# Patient Record
Sex: Female | Born: 1970
Health system: Southern US, Community
[De-identification: ages and names within clinical notes are randomized; demographics above are authoritative.]

## PROBLEM LIST (undated history)

## (undated) ENCOUNTER — Ambulatory Visit (HOSPITAL_COMMUNITY): Admission: EM | Payer: BC Managed Care – PPO | Source: Home / Self Care

## (undated) DIAGNOSIS — G709 Myoneural disorder, unspecified: Secondary | ICD-10-CM

## (undated) DIAGNOSIS — L309 Dermatitis, unspecified: Secondary | ICD-10-CM

## (undated) HISTORY — DX: Myoneural disorder, unspecified: G70.9

---

## 2005-09-07 ENCOUNTER — Emergency Department (HOSPITAL_COMMUNITY): Admission: EM | Admit: 2005-09-07 | Discharge: 2005-09-07 | Payer: Self-pay | Admitting: Emergency Medicine

## 2005-09-10 ENCOUNTER — Emergency Department (HOSPITAL_COMMUNITY): Admission: EM | Admit: 2005-09-10 | Discharge: 2005-09-10 | Payer: Self-pay | Admitting: Emergency Medicine

## 2005-11-20 ENCOUNTER — Inpatient Hospital Stay (HOSPITAL_COMMUNITY): Admission: AD | Admit: 2005-11-20 | Discharge: 2005-11-20 | Payer: Self-pay | Admitting: *Deleted

## 2006-03-16 ENCOUNTER — Inpatient Hospital Stay (HOSPITAL_COMMUNITY): Admission: AD | Admit: 2006-03-16 | Discharge: 2006-03-16 | Payer: Self-pay | Admitting: Obstetrics & Gynecology

## 2006-03-24 ENCOUNTER — Inpatient Hospital Stay (HOSPITAL_COMMUNITY): Admission: AD | Admit: 2006-03-24 | Discharge: 2006-03-28 | Payer: Self-pay | Admitting: Obstetrics & Gynecology

## 2006-03-31 ENCOUNTER — Inpatient Hospital Stay (HOSPITAL_COMMUNITY): Admission: AD | Admit: 2006-03-31 | Discharge: 2006-03-31 | Payer: Self-pay | Admitting: Obstetrics & Gynecology

## 2007-03-22 ENCOUNTER — Ambulatory Visit: Payer: Self-pay | Admitting: *Deleted

## 2007-03-22 ENCOUNTER — Ambulatory Visit: Payer: Self-pay | Admitting: Family Medicine

## 2007-03-22 LAB — CONVERTED CEMR LAB
ALT: 14 units/L (ref 0–35)
AST: 16 units/L (ref 0–37)
Albumin: 4.2 g/dL (ref 3.5–5.2)
Basophils Absolute: 0 10*3/uL (ref 0.0–0.1)
CO2: 24 meq/L (ref 19–32)
Calcium: 9.3 mg/dL (ref 8.4–10.5)
Chloride: 107 meq/L (ref 96–112)
Lymphocytes Relative: 47 % — ABNORMAL HIGH (ref 12–46)
Lymphs Abs: 1.9 10*3/uL (ref 0.7–3.3)
Neutro Abs: 1.7 10*3/uL (ref 1.7–7.7)
Platelets: 289 10*3/uL (ref 150–400)
Potassium: 4.3 meq/L (ref 3.5–5.3)
RDW: 13.6 % (ref 11.5–14.0)
Sed Rate: 14 mm/hr (ref 0–22)
Sodium: 140 meq/L (ref 135–145)
Total Protein: 7.5 g/dL (ref 6.0–8.3)
WBC: 4 10*3/uL (ref 4.0–10.5)

## 2007-03-23 ENCOUNTER — Encounter (INDEPENDENT_AMBULATORY_CARE_PROVIDER_SITE_OTHER): Payer: Self-pay | Admitting: Family Medicine

## 2007-03-23 LAB — CONVERTED CEMR LAB: TSH: 0.7 microintl units/mL

## 2007-04-17 ENCOUNTER — Ambulatory Visit: Payer: Self-pay | Admitting: Family Medicine

## 2007-04-20 ENCOUNTER — Encounter (INDEPENDENT_AMBULATORY_CARE_PROVIDER_SITE_OTHER): Payer: Self-pay | Admitting: Family Medicine

## 2007-04-20 DIAGNOSIS — R109 Unspecified abdominal pain: Secondary | ICD-10-CM | POA: Insufficient documentation

## 2007-04-23 DIAGNOSIS — M129 Arthropathy, unspecified: Secondary | ICD-10-CM | POA: Insufficient documentation

## 2007-08-14 ENCOUNTER — Ambulatory Visit: Payer: Self-pay | Admitting: Family Medicine

## 2007-10-25 ENCOUNTER — Emergency Department (HOSPITAL_COMMUNITY): Admission: EM | Admit: 2007-10-25 | Discharge: 2007-10-25 | Payer: Self-pay | Admitting: Family Medicine

## 2007-11-29 ENCOUNTER — Encounter (INDEPENDENT_AMBULATORY_CARE_PROVIDER_SITE_OTHER): Payer: Self-pay | Admitting: Family Medicine

## 2007-11-29 ENCOUNTER — Ambulatory Visit: Payer: Self-pay | Admitting: Internal Medicine

## 2007-12-06 ENCOUNTER — Ambulatory Visit (HOSPITAL_COMMUNITY): Admission: RE | Admit: 2007-12-06 | Discharge: 2007-12-06 | Payer: Self-pay | Admitting: Family Medicine

## 2008-04-01 ENCOUNTER — Encounter: Admission: RE | Admit: 2008-04-01 | Discharge: 2008-04-30 | Payer: Self-pay | Admitting: Obstetrics & Gynecology

## 2008-04-30 ENCOUNTER — Ambulatory Visit (HOSPITAL_COMMUNITY): Admission: RE | Admit: 2008-04-30 | Discharge: 2008-04-30 | Payer: Self-pay | Admitting: Obstetrics & Gynecology

## 2008-07-08 ENCOUNTER — Ambulatory Visit (HOSPITAL_COMMUNITY): Admission: RE | Admit: 2008-07-08 | Discharge: 2008-07-08 | Payer: Self-pay | Admitting: Obstetrics & Gynecology

## 2008-09-18 ENCOUNTER — Inpatient Hospital Stay (HOSPITAL_COMMUNITY): Admission: RE | Admit: 2008-09-18 | Discharge: 2008-09-24 | Payer: Self-pay | Admitting: Obstetrics & Gynecology

## 2008-09-21 ENCOUNTER — Encounter: Payer: Self-pay | Admitting: Obstetrics & Gynecology

## 2008-09-28 ENCOUNTER — Inpatient Hospital Stay (HOSPITAL_COMMUNITY): Admission: AD | Admit: 2008-09-28 | Discharge: 2008-09-29 | Payer: Self-pay | Admitting: Obstetrics & Gynecology

## 2008-11-10 ENCOUNTER — Emergency Department (HOSPITAL_COMMUNITY): Admission: EM | Admit: 2008-11-10 | Discharge: 2008-11-10 | Payer: Self-pay | Admitting: Family Medicine

## 2009-01-20 IMAGING — US US ABDOMEN COMPLETE
1 series · 14 of 25 positions shown · non-contrast
Comparison: None

CLINICAL DATA: Abdominal and epigastric pain.

ABDOMEN ULTRASOUND
TECHNIQUE: Complete abdominal ultrasound examination was performed
including evaluation of the liver, gallbladder, bile ducts,
pancreas, kidneys, spleen, IVC, and abdominal aorta.

[Series 1: unknown · 0.28mm/px · 14 of 80 slices shown]
[im 1/80]
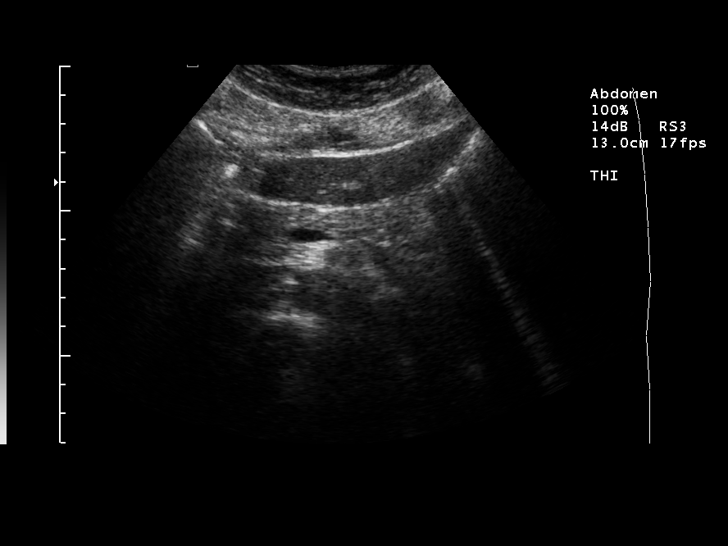
[im 7/80]
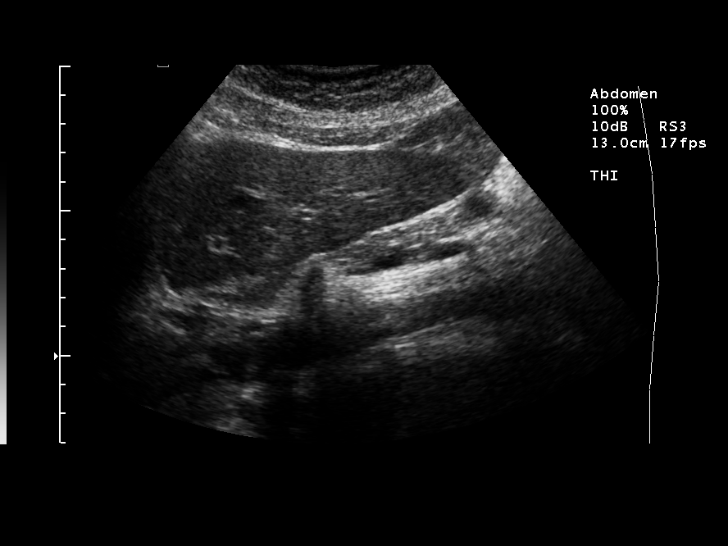
[im 14/80]
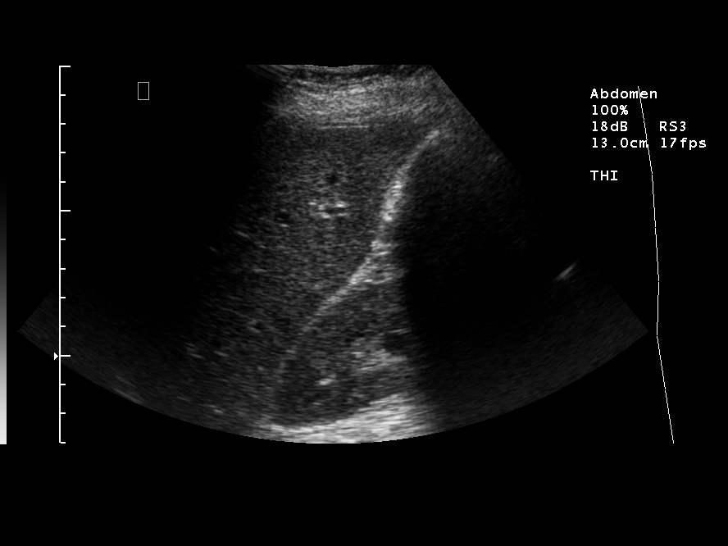
[im 20/80]
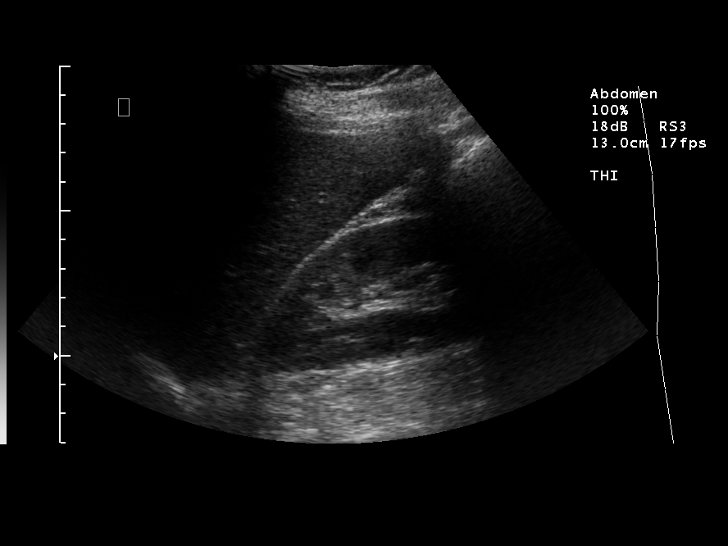
[im 27/80]
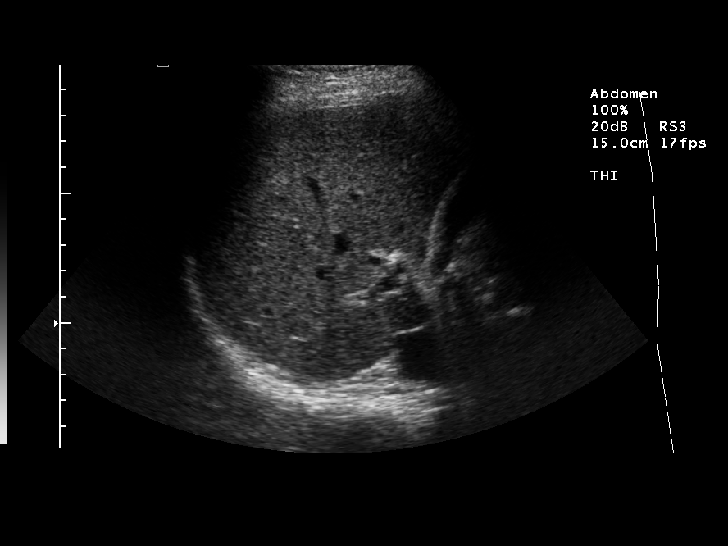
[im 30/80]
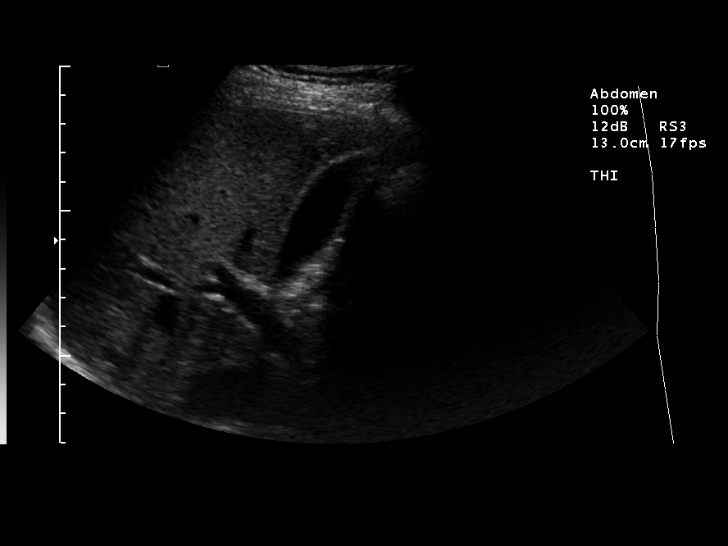
[im 37/80]
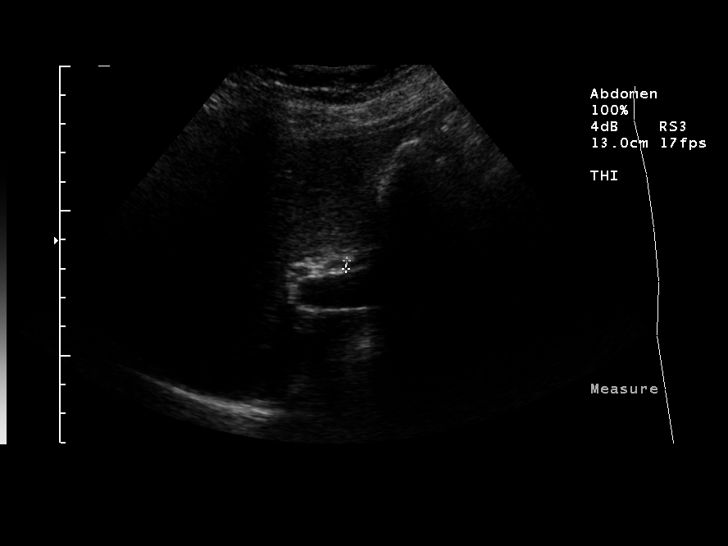
[im 43/80]
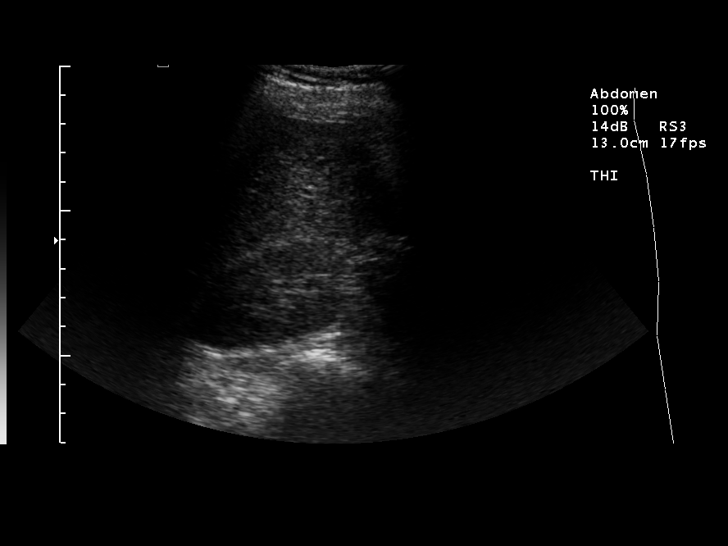
[im 50/80]
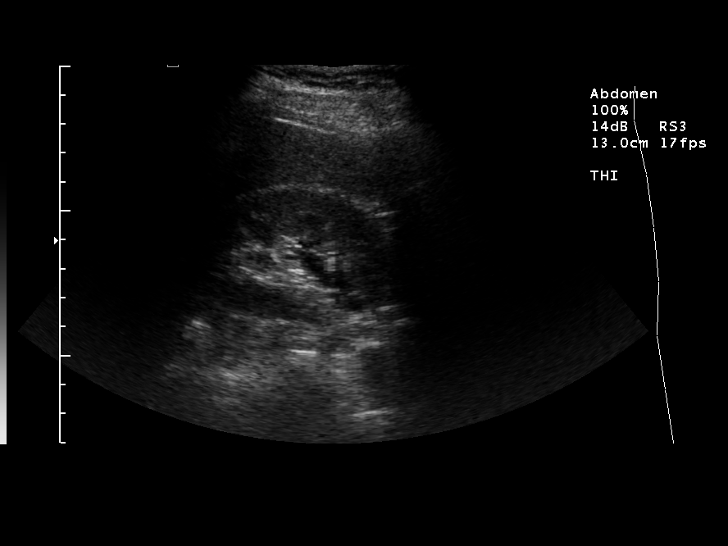
[im 53/80]
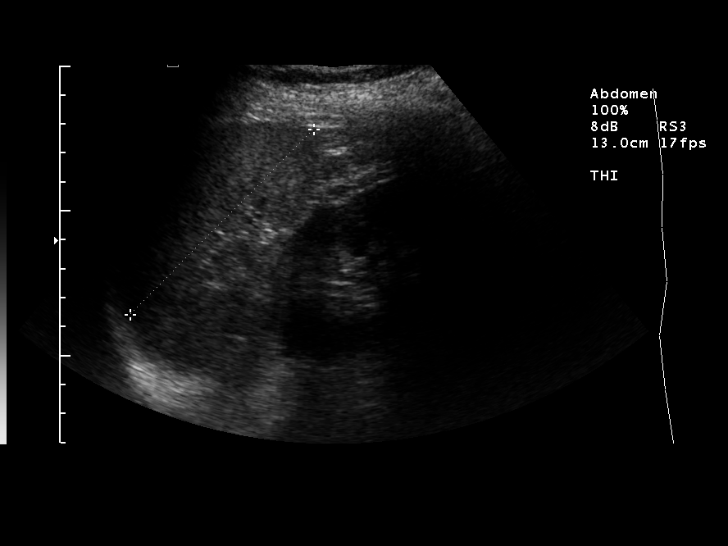
[im 60/80]
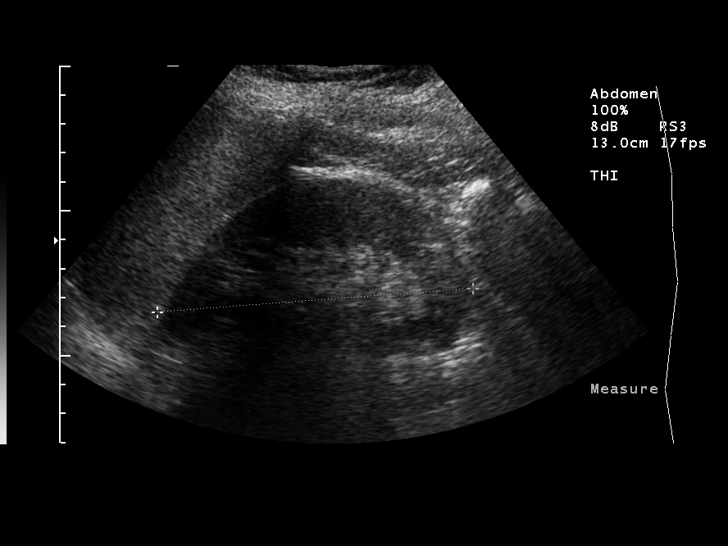
[im 66/80]
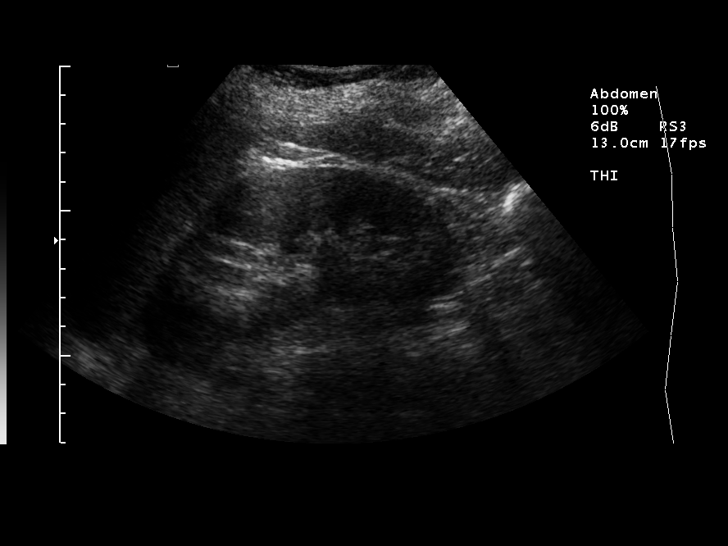
[im 73/80]
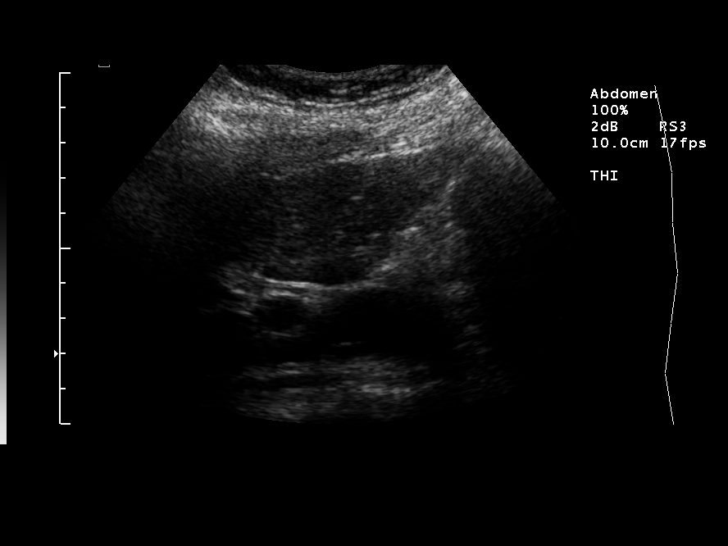
[im 80/80]
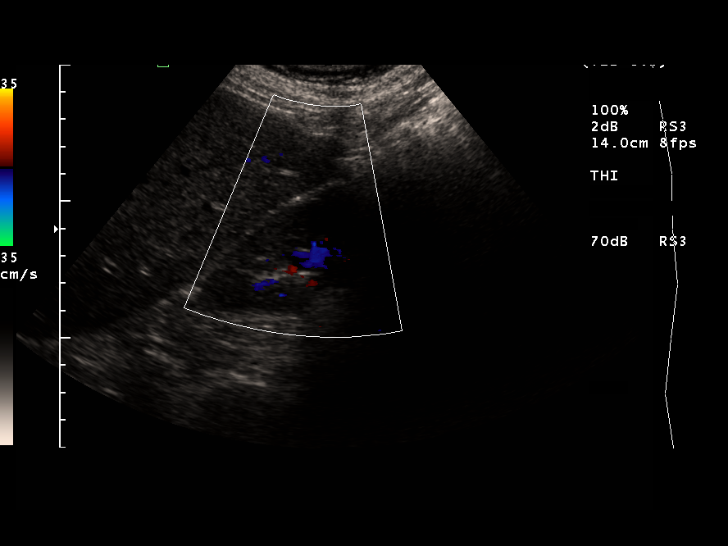

[14 of 25 positions shown; findings below may reference images not displayed]

FINDINGS: Gallbladder is normal without wall thickening, stone, or
pericholecystic fluid.  Sonographic Murphy's sign was not elicited.

Common duct normal at 3 mm.

Liver, IVC, pancreas all within normal limits.

Spleen normal in size and echotexture.

Right kidney 10.9 cm and left kidney 10.9 cm.  No hydronephrosis.

Abdominal aorta nonaneurysmal without ascites.
IMPRESSION: No acute abdominal process.

## 2009-05-04 ENCOUNTER — Encounter (INDEPENDENT_AMBULATORY_CARE_PROVIDER_SITE_OTHER): Payer: Self-pay | Admitting: Family Medicine

## 2009-05-04 ENCOUNTER — Ambulatory Visit: Payer: Self-pay | Admitting: Family Medicine

## 2009-05-04 LAB — CONVERTED CEMR LAB
Eosinophils Absolute: 0.1 10*3/uL (ref 0.0–0.7)
HCT: 38.3 % (ref 36.0–46.0)
Hemoglobin: 12.7 g/dL (ref 12.0–15.0)
Lymphs Abs: 2.2 10*3/uL (ref 0.7–4.0)
MCV: 86.7 fL (ref 78.0–100.0)
Monocytes Absolute: 0.4 10*3/uL (ref 0.1–1.0)
Monocytes Relative: 8 % (ref 3–12)
Neutrophils Relative %: 42 % — ABNORMAL LOW (ref 43–77)
RBC: 4.42 M/uL (ref 3.87–5.11)
WBC: 4.5 10*3/uL (ref 4.0–10.5)

## 2009-10-22 ENCOUNTER — Ambulatory Visit: Payer: Self-pay | Admitting: Family Medicine

## 2009-10-23 ENCOUNTER — Ambulatory Visit: Payer: Self-pay | Admitting: Family Medicine

## 2009-10-23 LAB — CONVERTED CEMR LAB
Chloride: 103 meq/L (ref 96–112)
Glucose, Bld: 91 mg/dL (ref 70–99)
Potassium: 4.1 meq/L (ref 3.5–5.3)
Sodium: 139 meq/L (ref 135–145)

## 2009-12-29 ENCOUNTER — Ambulatory Visit: Payer: Self-pay | Admitting: Family Medicine

## 2009-12-29 LAB — CONVERTED CEMR LAB: Vit D, 25-Hydroxy: 28 ng/mL — ABNORMAL LOW (ref 30–89)

## 2010-11-03 ENCOUNTER — Other Ambulatory Visit (HOSPITAL_COMMUNITY): Payer: Self-pay | Admitting: Family Medicine

## 2010-11-03 DIAGNOSIS — Z1231 Encounter for screening mammogram for malignant neoplasm of breast: Secondary | ICD-10-CM

## 2010-11-05 ENCOUNTER — Ambulatory Visit (HOSPITAL_COMMUNITY)
Admission: RE | Admit: 2010-11-05 | Discharge: 2010-11-05 | Disposition: A | Discharge status: Home / Self Care | Payer: Medicaid Other | Source: Ambulatory Visit | Attending: Family Medicine | Admitting: Family Medicine

## 2010-11-05 DIAGNOSIS — Z1231 Encounter for screening mammogram for malignant neoplasm of breast: Secondary | ICD-10-CM | POA: Insufficient documentation

## 2010-11-30 LAB — CBC
HCT: 38 % (ref 36.0–46.0)
Hemoglobin: 11.9 g/dL — ABNORMAL LOW (ref 12.0–15.0)
Hemoglobin: 12.8 g/dL (ref 12.0–15.0)
MCHC: 34.2 g/dL (ref 30.0–36.0)
MCHC: 34.3 g/dL (ref 30.0–36.0)
MCV: 90.7 fL (ref 78.0–100.0)
MCV: 91.6 fL (ref 78.0–100.0)
Platelets: 155 10*3/uL (ref 150–400)
Platelets: 368 10*3/uL (ref 150–400)
RBC: 3.81 MIL/uL — ABNORMAL LOW (ref 3.87–5.11)
RDW: 14.4 % (ref 11.5–15.5)
WBC: 5.1 10*3/uL (ref 4.0–10.5)
WBC: 7.1 10*3/uL (ref 4.0–10.5)

## 2010-11-30 LAB — COMPREHENSIVE METABOLIC PANEL
AST: 18 U/L (ref 0–37)
Albumin: 2.8 g/dL — ABNORMAL LOW (ref 3.5–5.2)
Alkaline Phosphatase: 75 U/L (ref 39–117)
Chloride: 107 mEq/L (ref 96–112)
Creatinine, Ser: 0.43 mg/dL (ref 0.4–1.2)
GFR calc Af Amer: >60 mL/min (ref >60)
Potassium: 3.6 mEq/L (ref 3.5–5.1)
Total Bilirubin: 0.5 mg/dL (ref 0.3–1.2)

## 2010-11-30 LAB — DIFFERENTIAL
Basophils Absolute: 0 10*3/uL (ref 0.0–0.1)
Eosinophils Relative: 2 % (ref 0–5)
Lymphocytes Relative: 31 % (ref 12–46)
Monocytes Absolute: 0.3 10*3/uL (ref 0.1–1.0)

## 2010-12-28 NOTE — Op Note (Signed)
NAMEVELDA, [name] NO.:  [id number]   MEDICAL RECORD NO.:  [id number]           PATIENT TYPE:   LOCATION:                                 FACILITY:   PHYSICIAN:  Roseanna Rainbow, M.D.DATE OF BIRTH:  November 18, 1970   DATE OF PROCEDURE:  DATE OF DISCHARGE:                               OPERATIVE REPORT   PREOPERATIVE DIAGNOSES:  Intrauterine pregnancy at term, arrest of  descent, persistent occiput posterior, suspicious fetal heart tracing  with prolonged decelerations.   POSTOPERATIVE DIAGNOSES:  Intrauterine pregnancy at term, arrest of  descent, persistent occiput posterior, suspicious fetal heart tracing  with prolonged decelerations, contraction ring of Bandl.   PROCEDURE:  Primary low uterine flap elliptical cesarean delivery via  low transverse incision.   SURGEON:  Roseanna Rainbow, MD   ANESTHESIA:  Epidural.   ESTIMATED BLOOD LOSS:  600 mL.   IV FLUIDS:  1500 mL crystalloid.   URINE OUTPUT:  200 mL.  The urine was blood tinged at the beginning of  the procedure.   SPECIMENS:  Placenta to Pathology.   COMPLICATIONS:  None.   PROCEDURE IN DETAIL:  The patient was taken to the operating room with  an IV running and an epidural catheter in situ.  She is then placed in  the dorsal supine position with a leftward tilt and prepped and draped  in the usual sterile fashion.  A transverse skin incision was then made  with the scalpel and carried down to the underlying fascia.  The fascia  was nicked in the midline.  The fascial incision was then extended  bilaterally.  The superior aspect of the fascial incision was tented up  and the underlying rectus muscle was dissected off.  The inferior aspect  of the fascial incision was manipulated in a similar fashion.  The  rectus muscles were separated in the midline.  The parietal peritoneum  was tented up and entered sharply.  This incision was then extended  superiorly and inferiorly with good  visualization of the bladder.  Upon  inspection of the lower uterine segment, there was a sharp demarcation  between the lower uterine segment and the muscle of the fundus  consistent with a likely Bandl's ring.  The vesicouterine peritoneum was  tented up and entered sharply.  This incision was then extended  bilaterally and a bladder flap created bluntly.  The bladder blade was  then replaced.  Lower uterine segment was incised in transverse fashion.  This incision was then extended bluntly.  The infant's head was  delivered atraumatically.  The oropharynx was suctioned with bulb  suction.  The cord was clamped and cut.  The infant was handed off to  the awaiting neonatologist.  An umbilical artery pH was sent and was  7.22.  The Apgars were 7 and 9 at 1 and 5 minutes respectively.  The  weight was 8 pounds.  The placenta was then removed.  The intrauterine  cavity was great evacuated of any remaining amniotic fluid clots and  debrided with moist laparotomy sponge.  The uterine incision was  then  reapproximated in a running interlocking fashion using a running suture  of 0 Monocryl.  A second imbricating layer of the same suture was  placed.  Adequate hemostasis was noted.  The paracolic gutters were then  irrigated.  The parietal peritoneum was reapproximated in a running  fashion using a running suture of 2-0 Vicryl.  The fascia was closed in  a running fashion using 2 sutures of 0 Vicryl tied in the midline.  The  skin was closed with staples.  At the close of the procedure, the  instrument and pack counts were said to be correct x2.  The patient was  taken to the PACU awake and in stable condition.      Roseanna Rainbow, M.D.  Electronically Signed     LAJ/MEDQ  D:  09/21/2008  T:  09/21/2008  Job:  [id number]

## 2010-12-31 NOTE — Discharge Summary (Signed)
[name], [name] NO.:  [id number]   MEDICAL RECORD NO.:  [id number]          PATIENT TYPE:  INP   LOCATION:  9105                          FACILITY:  [facility]   PHYSICIAN:  Roseanna Rainbow, M.D.DATE OF BIRTH:  03-18-1971   DATE OF ADMISSION:  09/18/2008  DATE OF DISCHARGE:  09/24/2008                               DISCHARGE SUMMARY   CHIEF COMPLAINT:  The patient is a 40 year old, para 1, with an  estimated date of confinement of September 16, 2008, with an intrauterine  pregnancy at 40 plus weeks with a possible breech presentation for a  version attempt and possible cesarean delivery.   HISTORY OF PRESENT ILLNESS:  At the last office visit, the lie of the  fetus was felt to be oblique.  On ultrasound in the radiology suite at  [facility] today, the presentation was felt to be frank breech.  The patient has a very lax anterior abdominal wall.   ALLERGIES:  No known drug allergies.   MEDICATIONS:  Please see the medication reconciliation form.   PRENATAL LABORATORY DATA:  Blood type A positive.  Antibody screen  negative.  Chlamydia probe negative.  Urine culture and sensitivity  insignificant growth.  Pap smear negative.  GC probe negative.  One-hour  GCT 84.  Hepatitis B surface antigen negative.  RPR nonreactive.  Rubella immune.  Sickle cell negative.  Varicella immune.  HIV  nonreactive.  GBS negative on August 14, 2008.  Hemoglobin 11.1,  platelets 195,000.   PAST GYN HISTORY:  Noncontributory.   PAST MEDICAL HISTORY:  She denies.   PAST SURGICAL HISTORY:  She denies.   SOCIAL HISTORY:  She is a Futures trader, married.  She denies any tobacco,  ethanol, or drug use.   OB RISK FACTORS:  Advanced maternal age.   PAST OBSTETRICAL HISTORY:  There is a history of a spontaneous vaginal  delivery at term.   REVIEW OF SYSTEMS:  Noncontributory.   PHYSICAL EXAMINATION:  VITAL SIGNS:  Stable, afebrile.  Fetal heart  tracing reassuring.  GENERAL:  No apparent distress, well developed, well nourished.  ABDOMEN:  Gravid.  GENITALIA:  Sterile vaginal exam per the RN.  The cervix is closed.   ASSESSMENT AND PLAN:  Primipara at term, questionable breech  presentation for a version attempt.  Plan admission version attempt with  ultrasound guidance.  If unsuccessful, we will proceed with a cesarean  delivery.  If successful, we will attempt an induction of labor.   HOSPITAL COURSE:  The patient was admitted.  On an ultrasound at the  bedside performed by a Radiology technician, the lie again was felt to  be oblique.  At this point, the cervix was ripened with Cytotec.  There  was minimal cervical change after several Cytotec had then placed.  At  this point, the decision was made to proceed with a Foley bulb  induction.  The Foley bulb was unable to be placed.  Low-dose Pitocin  was then started.  She progressed to 4 cm of dilatation.  The membranes  were artificially ruptured for clear fluid, and  there was a prolonged  fetal heart rate deceleration.  At this point, the cervix was 6 cm  dilated.  An intrauterine pressure catheter was placed as well as a  fetal scalp electrode.  There was recovery of the FHR to baseline.  The  patient progressed to fully dilated.  Pushing efforts were associated  with  a prolonged FHR deceleration with the nadir 60 beat per minute  rate.  She was felt to have a persistent occiput posterior position.  In  light of the suspicious fetal heart tracing and arrest of descent, the  decision was made to proceed with a cesarean delivery.  Please see the  dictated operative summary.  On postoperative day 1, her hemoglobin was  10.  The remainder of her postoperative course was uneventful.  She was  discharged to home on postoperative day 3 tolerating a regular diet.   DISCHARGE DIAGNOSIS:  Arrest of descent, active phase of labor, occiput  posterior presentation.   PROCEDURE:  Cesarean delivery.    CONDITION:  Stable.   DIET:  Regular.   ACTIVITY:  Progressive activity, pelvic rest.   MEDICATIONS:  Micronor and Percocet.   DISPOSITION:  The patient was to follow up in the office in 2 weeks.      Roseanna Rainbow, M.D.  Electronically Signed     LAJ/MEDQ  D:  10/24/2008  T:  10/25/2008  Job:  [id number]

## 2011-05-09 LAB — POCT URINALYSIS DIP (DEVICE)
Ketones, ur: NEGATIVE
Protein, ur: NEGATIVE
pH: 6.5

## 2011-05-23 ENCOUNTER — Inpatient Hospital Stay (INDEPENDENT_AMBULATORY_CARE_PROVIDER_SITE_OTHER)
Admission: RE | Admit: 2011-05-23 | Discharge: 2011-05-23 | Disposition: A | Discharge status: Home / Self Care | Payer: Medicaid Other | Source: Ambulatory Visit | Attending: Family Medicine | Admitting: Family Medicine

## 2011-05-23 DIAGNOSIS — R05 Cough: Secondary | ICD-10-CM

## 2011-05-23 DIAGNOSIS — R059 Cough, unspecified: Secondary | ICD-10-CM

## 2011-07-01 ENCOUNTER — Other Ambulatory Visit (HOSPITAL_COMMUNITY): Payer: Self-pay | Admitting: Family Medicine

## 2011-07-01 DIAGNOSIS — R634 Abnormal weight loss: Secondary | ICD-10-CM

## 2011-07-01 DIAGNOSIS — N926 Irregular menstruation, unspecified: Secondary | ICD-10-CM

## 2011-07-04 ENCOUNTER — Other Ambulatory Visit (HOSPITAL_COMMUNITY): Payer: Self-pay | Admitting: Family Medicine

## 2011-07-04 ENCOUNTER — Ambulatory Visit (HOSPITAL_COMMUNITY)
Admission: RE | Admit: 2011-07-04 | Discharge: 2011-07-04 | Disposition: A | Discharge status: Home / Self Care | Payer: Medicaid Other | Source: Ambulatory Visit | Attending: Family Medicine | Admitting: Family Medicine

## 2011-07-04 DIAGNOSIS — N926 Irregular menstruation, unspecified: Secondary | ICD-10-CM | POA: Insufficient documentation

## 2011-07-04 DIAGNOSIS — R109 Unspecified abdominal pain: Secondary | ICD-10-CM | POA: Insufficient documentation

## 2011-07-04 DIAGNOSIS — R634 Abnormal weight loss: Secondary | ICD-10-CM

## 2011-08-04 ENCOUNTER — Other Ambulatory Visit: Payer: Self-pay | Admitting: Family Medicine

## 2011-09-02 ENCOUNTER — Encounter: Payer: Self-pay | Admitting: Obstetrics and Gynecology

## 2011-09-02 ENCOUNTER — Ambulatory Visit (INDEPENDENT_AMBULATORY_CARE_PROVIDER_SITE_OTHER): Payer: Medicaid Other | Admitting: Obstetrics & Gynecology

## 2011-09-02 DIAGNOSIS — N926 Irregular menstruation, unspecified: Secondary | ICD-10-CM | POA: Insufficient documentation

## 2011-09-02 DIAGNOSIS — Z309 Encounter for contraceptive management, unspecified: Secondary | ICD-10-CM

## 2011-09-02 MED ORDER — NORGESTIMATE-ETH ESTRADIOL 0.25-35 MG-MCG PO TABS: 1.0000 | ORAL_TABLET | Freq: Every day | ORAL | Status: DC

## 2011-09-02 NOTE — Patient Instructions (Signed)
Oral Contraceptives Oral contraceptives (OCs) are medicines taken to prevent pregnancy. They are the most widely used method of birth control. OCs work by preventing the ovaries from releasing eggs. The OC hormones also cause the mucus on the cervix to thicken, preventing the sperm from entering the uterus. They also cause the lining of the uterus to become thin, not allowing a fertilized egg to attach to the inside of the uterus. OCs have a failure rate of less than 1%, when taken exactly as prescribed. THERE ARE 2 TYPES OF OC  OC that contains a mix of estrogen and progesterone hormones is the most common OC used. It is taken for 21 days, followed by 7 days of not taking the OC hormones. It can be packaged as 28 pills, with the last 7 pills being inactive. You take a pill every day. This way you do not need to remember when to restart taking the active pills. Most women will begin their menstrual period 2 to 3 days after taking the hormone pill. The menstrual period is usually lighter and shorter. This combination OC should not be taken if you are breast-feeding.   The progesterone only (minipill) OC does not contain estrogen. It is taken every day, continuously. You may have only spotting for a period, or no period at all. The progesterone only OC can be taken if you are breast-feeding your baby, it is known to cause irregular bleeding.  OCs come in:  Packs of 21 pills, with no pills to take for 7 days after the last pill.   Packs of 28 pills, with a pill to take every day. The last 7 pills are without hormones.   Packs of 91 pills (continuous or extended use), with a pill to take every day. The first 84 pills contain the hormones, and the last 7 pills do not. That is when you will have your menstrual period. You will not have a menstrual period during the time you are taking the first 84 pills.  HOW TO TAKE OC Your caregiver may advise you on how to start taking the first cycle of OCs. Otherwise,  you can:  Start on day 1 or day 5 of your menstrual period, taking the first pack of the OC. You will not need any backup contraceptive protection with this start time.   Start on the first Sunday after your menstrual period, day 7 of your menstrual period, or the day you get your prescription. In these cases, you will need backup contraceptive protection for the first cycle.  No matter which day you start the OC, you will always start a new pack on that same day of the week. It is a good idea to have an extra pack of OCs and a backup contraceptive method available, in case you miss some pills or lose your OC pack. COMMON REASONS FOR FAILURE   Forgetting to take the pill at the same time every day.   Poor absorption of the pill from the stomach into the bloodstream. This can be caused by diarrhea, vomiting, and the use of some medicines that kill germs (antibiotics).   Stomach or intestinal disease.   Taking OCs with other medicines that may make them less effective (carbamazepine, phenytoin, phenobarbital, rifampin).   Using OCs that have passed their expiration dates.   Forgetting to restart the pills on day 7, when using the packs of 21 pills.  If you forget to take 1 pill, take it as soon as you remember,  and take the next pill at the regular time. If you miss 2 or more pills, use backup birth control until your next menstrual period starts. Also, you may have vaginal spotting or bleeding when you miss 2 or more OC pills. If you use the pack of 28 pills or 91 pills, and you miss 1 of the last 7 pills (pills with no hormones), it will not matter. Just throw away the rest of the non-hormone pills and start a new 28 or 91 pill pack. COMMON USES OF OC  Decreasing premenstrual problems (symptoms).   Treating menstrual period cramps.   Avoiding becoming pregnant.   Regulating the menstrual cycle.   Treating acne.   Decreasing the heavy menstrual flow.   Treating dysfunctional  (abnormal) uterine bleeding.   Treating chronic pelvic pain.   Treating polycystic ovary syndrome (ovary does not ovulate and produces tiny cysts).   Treating endometriosis (uterus lining growing in the pelvis, tubes, and ovaries).   Can be used for emergency contraception.  OCs DO NOT prevent sexually transmitted diseases (STDs). Safer sex practices, such as using condoms along with the pill, can help prevent STDs.  BENEFITS  OC reduces the risk of:   Cancer of the ovary and uterus.   Ovarian cysts.   Pelvic infection.   Symptoms of polycystic ovary syndrome.   Loss of bone (osteoporosis).   Noncancerous (benign) breast disease (fibrocystic breast changes).   Lack of red blood cells (anemia) from heavy or long menstrual periods.   Pregnancy occurring outside the uterus (tubal pregnancy).   Acne.   Slows down the flow of heavy menstrual periods.   Sometimes helps control premenstrual syndrome (PMS).   Stops menstrual cramps and pain.   Controls irregular menstrual periods.   Can be used as emergency contraception.  YOU SHOULD NOT TAKE THE PILL IF YOU:  Are pregnant, or are trying to get pregnant.   Have unexplained or abnormal vaginal bleeding.   Have a history of liver disease, stroke, or heart attack.   Smoke.   Have a history of blood clots, cancer, or heart problems.   Have gallbladder disease.   Have breast cancer or suspect breast cancer.   Have or suspect pelvic cancer.   Have high blood pressure.   Have high cholesterol or high triglycerides.   Have mental depression.   Are breast-feeding, except for the progesterone only OC, with approval of your caregiver.   Have diabetes with kidney, eye, or other blood vessel complications. Or if you have diabetes for 20 years or more.   Have heart valve disease.   Have migraine headaches. They may get worse.  Before taking the pill, a woman will have a physical exam and Pap test. Your caregiver may  order blood tests to check blood sugar and cholesterol levels, and other blood tests that may be necessary. SIDE EFFECTS OF THE PILL MAY INCLUDE:  Breast tenderness, pain and discharge.   Change in sex drive (increased or decreased libido).   Depression.   Being tired often.   Headaches.   Anxiety.   Irregular spotting or vaginal bleeding for a couple of months.   Leg pain.   Cramps, or swelling of your limbs (extremities).   Mood swings.   Weight loss or weight gain.   Feeling sick to your stomach (nausea).   Change in appetite (hunger).   Loss of hair.   Yeast or fungus vaginal infection.   Nervousness.   Rash.   Acne.  No menstrual period (amenorrhea).  When starting an OC, it is usually best to allow 2-3 months, if possible, for the body to adjust (before stopping because of side effects). This allows for adjustment to the changes in hormone levels. If a woman continues to have side effects, it may be possible to change to a different OC. It is important to discuss side effects with your caregiver. Often, changing to a different pill causes the side effects to subside. RISKS AND COMPLICATIONS   Blood clots of the leg, heart, lung, or brain.   High blood pressure.   Gallbladder disease.   Liver tumors.   Brain bleeding (hemorrhage).   Slight risk of breast cancer.  HOME CARE INSTRUCTIONS   Do not smoke.   Only take over-the-counter or prescription medicines for pain, discomfort, fever, or breast tenderness as directed by your caregiver.   Always use a condom to protect against sexually transmitted disease. OCs do not protect against STDs.   Keep a calendar, marking your menstrual period days.  Recommendations, types, and dosages of OC use change continually. Discuss your choices with your caregiver, and decide what is best for you. There are always exceptions to guidelines. You should always read the information that comes with the OC, and check  whether there are any new recommendations or guidelines. SEEK MEDICAL CARE IF:   You develop nausea and vomiting from the OC.   You have abnormal vaginal discharge.   You need treatment for headaches.   You develop a rash.   You miss your menstrual period.   You develop abnormal vaginal bleeding.   You are losing your hair.   You need treatment for mood swings or depression.   You get dizzy when taking the OC.   You develop acne from taking the OC.  SEEK IMMEDIATE MEDICAL CARE IF:   You develop leg pain.   You develop chest pain.   You develop shortness of breath.   You develop abdominal pain.   You have an uncontrolled headache.   You develop numbness or slurred speech.   You develop visual problems (loss of vision, double, or blurry vision).   You develop heavy vaginal bleeding.  If you are taking the pill, STOP RIGHT AWAY and CALL YOUR CAREGIVER IMMEDIATELY if the following occur:  You develop chest pain and shortness of breath.   You develop pain, redness, and swelling in the legs.   You develop severe headaches, visual changes, or belly (abdominal) pain.   You develop severe depression.   You become pregnant.  Document Released: 10/22/2002 Document Revised: 09/03/2010 Document Reviewed: 08/13/2009 [facility] Patient Information 2012 [place], [place].

## 2011-09-02 NOTE — Progress Notes (Signed)
History:  41 y.o. [id number] here today for report of irregular bleeding on OCPs.  Patient speaks [place] ([place] language) and Kennyth Lose Interpreter # [phone] was used during this encounter. She reports she was started on the progesterone only minipills after delivery three years ago because she was breastfeeding.  She has been on it, but in the last few months, she started having spotting most days of the month. Denies any signs/symptoms of anemia.  The following portions of the patient's history were reviewed and updated as appropriate: allergies, current medications, past family history, past medical history, past social history, past surgical history and problem list.  Normal pap smear in 07/2011; normal mammogram in 10/2010.   Objective:  Physical Exam Blood pressure 106/72, pulse 82, temperature 97 F (36.1 C), temperature source Oral, height 5\' 3"  (1.6 m), weight 138 lb (62.596 kg), last menstrual period 07/30/2011. Gen: NAD Abd: Soft, nontender and nondistended Pelvic: Normal appearing external genitalia; normal appearing vaginal mucosa and cervix.  Normal discharge, no bleeding currently.  Normal uterus, no palpable masses or adnexal tenderness.  Labs and Imaging 07/04/2011 TRANSABDOMINAL AND TRANSVAGINAL ULTRASOUND OF PELVIS  Uterus: Uterus measures 8.9 x 3.1 x 4.7 cm. There is an intramural subserosal uterine leiomyoma measuring 2.2 x 2.2 x 1.6 cm. Endometrium: Endometrial stripe thickness is 2.3 mm. No endometrial mass or fluid collection is seen. Right ovary: Right ovary measured 1.8 x 1.3 x 1.3 cm. No right ovarian or right adnexal abnormality is seen. Left ovary: Left ovary cannot be visualized. No left adnexal abnormality is seen. Other findings: No free fluid is seen in the pelvis. No hydrosalpinx is evident. IMPRESSION: There is a uterine leiomyoma. No endometrial abnormality is seen. No right ovarian lesion is seen. Left ovary cannot be visualized. No adnexal lesion was  evident.  Assessment & Plan:  Normal examination.  Bleeding likely secondary to minipills, patient needs to be on a combined estrogen-progesterone OCP.  Sprintec prescribed.  Will continue to monitor bleeding, if worsens or continues may need endometrial biopsy or other further evaluation and management.

## 2011-10-18 ENCOUNTER — Other Ambulatory Visit: Payer: Self-pay

## 2012-10-11 ENCOUNTER — Emergency Department (HOSPITAL_COMMUNITY)
Admission: EM | Admit: 2012-10-11 | Discharge: 2012-10-11 | Disposition: A | Discharge status: Home / Self Care | Payer: BC Managed Care – PPO | Source: Home / Self Care

## 2012-10-11 ENCOUNTER — Encounter (HOSPITAL_COMMUNITY): Payer: Self-pay | Admitting: Emergency Medicine

## 2012-10-11 DIAGNOSIS — L259 Unspecified contact dermatitis, unspecified cause: Secondary | ICD-10-CM

## 2012-10-11 DIAGNOSIS — L309 Dermatitis, unspecified: Secondary | ICD-10-CM

## 2012-10-11 HISTORY — DX: Dermatitis, unspecified: L30.9

## 2012-10-11 MED ORDER — METHYLPREDNISOLONE 4 MG PO KIT: PACK | ORAL | Status: DC

## 2012-10-11 MED ORDER — TRIAMCINOLONE ACETONIDE 0.1 % EX CREA: TOPICAL_CREAM | Freq: Two times a day (BID) | CUTANEOUS | Status: DC

## 2012-10-11 NOTE — ED Provider Notes (Signed)
History     CSN: [id number]  Arrival date & time 10/11/12  1031   First MD Initiated Contact with Patient 10/11/12 1047      Chief Complaint  Patient presents with  . Eczema    (Consider location/radiation/quality/duration/timing/severity/associated sxs/prior treatment) HPI Comments: 42 year old female who presents with an itchy rash. This rash has been a problem for her for over 3 years. It occurs intermittently. She has been diagnosed with eczema in the former [facility] system. She had been given a steroid cream that helped a great deal. The rash is reddish brown to dark gray and raised located on the upper extremities in the, elbows and around the neck. Lesser on the trunk. Her skin color is dark as she is middle [place] and the appearance is somewhat different than that of a Caucasian. Denies systemic symptoms. Denies signs of infection.   No past medical history on file.  Past Surgical History  Procedure Laterality Date  . Cesarean section      one section     No family history on file.  History  Substance Use Topics  . Smoking status: Never Smoker   . Smokeless tobacco: Never Used  . Alcohol Use: No    OB History   Grav Para Term Preterm Abortions TAB SAB Ect Mult Living   2 2 2       2       Review of Systems  Skin:       See history of present illness  All other systems reviewed and are negative.    Allergies  Review of patient's allergies indicates no known allergies.  Home Medications   Current Outpatient Rx  Name  Route  Sig  Dispense  Refill  . methylPREDNISolone (MEDROL DOSEPAK) 4 MG tablet      follow package directions   21 tablet   0   . norethindrone (MICRONOR,CAMILA,ERRIN) 0.35 MG tablet   Oral   Take 1 tablet by mouth daily.         Marland Kitchen EXPIRED: norgestimate-ethinyl estradiol (ORTHO-CYCLEN,SPRINTEC,PREVIFEM) 0.25-35 MG-MCG tablet   Oral   Take 1 tablet by mouth daily.   1 Package   11   . triamcinolone cream (KENALOG) 0.1  %   Topical   Apply topically 2 (two) times daily. Apply for 2 weeks. May use on face   30 g   0     BP 107/72  Pulse 78  Temp(Src) 97.9 F (36.6 C) (Oral)  SpO2 100%  Physical Exam  Nursing note and vitals reviewed. Constitutional: She is oriented to person, place, and time. She appears well-developed and well-nourished. No distress.  HENT:  Head: Normocephalic and atraumatic.  Nose: Nose normal.  Mouth/Throat: Oropharynx is clear and moist. No oropharyngeal exudate.  Eyes: Conjunctivae and EOM are normal.  Neck: Normal range of motion. Neck supple.  Cardiovascular: Normal rate, regular rhythm and normal heart sounds.   Pulmonary/Chest: Effort normal. No respiratory distress. She has no wheezes.  Musculoskeletal: She exhibits no edema and no tenderness.  Neurological: She is alert and oriented to person, place, and time. She exhibits normal muscle tone.  Skin: Skin is warm and dry. Rash noted.  CHP for rash prescription.  Psychiatric: She has a normal mood and affect. Her behavior is normal.    ED Course  Procedures (including critical care time)  Labs Reviewed - No data to display No results found.   1. Eczematous dermatitis       MDM  Patient  is discharged in stable condition. This rash has been occurring intermittently for over 2 years. She is previously been diagnosed with eczema and this is what it looks like. We will treat her with triamcinolone 0.1% cream apply sparingly to affected areas twice a day and a Medrol Dosepak. He is instructed to follow with her primary care doctor and if she does not have one to try to obtain one as soon as possible since she now has Nurse, learning disability.                      Hayden Rasmussen, NP 10/11/12 [phone]

## 2012-10-11 NOTE — ED Provider Notes (Signed)
Medical screening examination/treatment/procedure(s) were performed by non-physician practitioner and as supervising physician I was immediately available for consultation/collaboration.  Leslee Home, M.D.   Reuben Likes, MD 10/11/12 1115

## 2012-10-11 NOTE — ED Notes (Signed)
Eczema for many years, rash /skin irritation has returned.

## 2013-04-15 ENCOUNTER — Emergency Department (INDEPENDENT_AMBULATORY_CARE_PROVIDER_SITE_OTHER): Payer: BC Managed Care – PPO

## 2013-04-15 ENCOUNTER — Encounter (HOSPITAL_COMMUNITY): Payer: Self-pay

## 2013-04-15 ENCOUNTER — Emergency Department (HOSPITAL_COMMUNITY)
Admission: EM | Admit: 2013-04-15 | Discharge: 2013-04-15 | Disposition: A | Discharge status: Home / Self Care | Payer: BC Managed Care – PPO | Source: Home / Self Care | Attending: Emergency Medicine | Admitting: Emergency Medicine

## 2013-04-15 DIAGNOSIS — M5430 Sciatica, unspecified side: Secondary | ICD-10-CM

## 2013-04-15 DIAGNOSIS — M5431 Sciatica, right side: Secondary | ICD-10-CM

## 2013-04-15 MED ORDER — IBUPROFEN 600 MG PO TABS: 600.0000 mg | ORAL_TABLET | Freq: Three times a day (TID) | ORAL | Status: DC | PRN

## 2013-04-15 MED ORDER — KETOROLAC TROMETHAMINE 30 MG/ML IJ SOLN
30.0000 mg | Freq: Once | INTRAMUSCULAR | Status: AC
  Administered 2013-04-15: 30 mg via INTRAMUSCULAR

## 2013-04-15 MED ORDER — PREDNISONE 20 MG PO TABS: ORAL_TABLET | ORAL | Status: DC

## 2013-04-15 MED ORDER — CYCLOBENZAPRINE HCL 5 MG PO TABS: 5.0000 mg | ORAL_TABLET | Freq: Three times a day (TID) | ORAL | Status: DC | PRN

## 2013-04-15 MED ORDER — KETOROLAC TROMETHAMINE 30 MG/ML IJ SOLN
INTRAMUSCULAR | Status: AC
  Filled 2013-04-15: qty 1

## 2013-04-15 NOTE — ED Provider Notes (Signed)
CSN: [id number]     Arrival date & time 04/15/13  1118 History   None    Chief Complaint  Patient presents with  . Back Pain   (Consider location/radiation/quality/duration/timing/severity/associated sxs/prior Treatment) Patient is a 42 y.o. female presenting with back pain. The history is provided by the patient and the spouse.  Back Pain Location:  Sacro-iliac joint Quality:  Shooting Radiates to:  L posterior upper leg (and posterior lower left leg) Pain severity:  Severe Pain is:  Same all the time Onset quality:  Gradual Duration:  2 weeks Timing:  Constant Progression:  Worsening Chronicity:  Recurrent Relieved by:  Nothing Worsened by:  Sitting Ineffective treatments:  Muscle relaxants Associated symptoms: leg pain, numbness and tingling   Associated symptoms: no abdominal pain, no bladder incontinence, no bowel incontinence and no fever     Past Medical History  Diagnosis Date  . Eczema    Past Surgical History  Procedure Laterality Date  . Cesarean section      one section    History reviewed. No pertinent family history. History  Substance Use Topics  . Smoking status: Never Smoker   . Smokeless tobacco: Never Used  . Alcohol Use: No   OB History   Grav Para Term Preterm Abortions TAB SAB Ect Mult Living   2 2 2       2      Review of Systems  Constitutional: Negative for fever and chills.  Gastrointestinal: Negative for abdominal pain and bowel incontinence.  Genitourinary: Negative for bladder incontinence.  Musculoskeletal: Positive for back pain.  Neurological: Positive for tingling and numbness.    Allergies  Review of patient's allergies indicates no known allergies.  Home Medications   Current Outpatient Rx  Name  Route  Sig  Dispense  Refill  . cyclobenzaprine (FLEXERIL) 5 MG tablet   Oral   Take 1 tablet (5 mg total) by mouth 3 (three) times daily as needed for muscle spasms.   42 tablet   0   . ibuprofen (ADVIL,MOTRIN) 600 MG  tablet   Oral   Take 1 tablet (600 mg total) by mouth every 8 (eight) hours as needed for pain.   42 tablet   0   . methylPREDNISolone (MEDROL DOSEPAK) 4 MG tablet      follow package directions   21 tablet   0   . norethindrone (MICRONOR,CAMILA,ERRIN) 0.35 MG tablet   Oral   Take 1 tablet by mouth daily.         Marland Kitchen EXPIRED: norgestimate-ethinyl estradiol (ORTHO-CYCLEN,SPRINTEC,PREVIFEM) 0.25-35 MG-MCG tablet   Oral   Take 1 tablet by mouth daily.   1 Package   11   . predniSONE (DELTASONE) 20 MG tablet      Take 3 tabs daily for 3 days, then 2 tabs daily for 3 days, then 1 tab daily for 3 days, then 1/2 tab daily for 3 days   20 tablet   0   . triamcinolone cream (KENALOG) 0.1 %   Topical   Apply topically 2 (two) times daily. Apply for 2 weeks. May use on face   30 g   0    BP 108/70  Pulse 74  Temp(Src) 98.3 F (36.8 C) (Oral)  Resp 18  SpO2 98% Physical Exam  Constitutional: She appears well-developed and well-nourished.  Appears in pain, doesn't want to sit down due to pain  Musculoskeletal:       Lumbar back: She exhibits tenderness and pain. She exhibits  no bony tenderness and no deformity.       Back:    ED Course  Procedures (including critical care time) Labs Review Labs Reviewed - No data to display Imaging Review Dg Lumbar Spine 2-3 Views  04/15/2013   *RADIOLOGY REPORT*  Clinical Data: low back pain down left leg for one month  LUMBAR SPINE - 2-3 VIEW  Comparison: None.  Findings: Normal alignment.  No fracture.  Minimal degenerative disk disease at L2-3, L3-4, and L4-5.  IMPRESSION: No acute findings   Original Report Authenticated By: Esperanza Heir, M.D.    MDM   1. Sciatica neuralgia, right    rx prednisone 60mg  x3 days, then 40mg  x3 days, then 20mg  for 3 days, then 10mg  for 3 days. Rx ibuprofen 600mg  q8 hours #42; rx flexeril 5mg  TID #42. Refer to [facility] for f/u since this is a long standing, recurring problem for the pt.     Cathlyn Parsons, NP 04/15/13 1342

## 2013-04-15 NOTE — ED Provider Notes (Signed)
Medical screening examination/treatment/procedure(s) were performed by non-physician practitioner and as supervising physician I was immediately available for consultation/collaboration.  Leslee Home, M.D.  Reuben Likes, MD 04/15/13 [phone]

## 2013-04-15 NOTE — ED Notes (Signed)
C/o 3 week duration of pain in low back w radicular syx into left leg; pain worse w certain positions, difficult to stand erect at times, numbness and tingling in left foot. Denies problems w urination

## 2013-07-31 DIAGNOSIS — E559 Vitamin D deficiency, unspecified: Secondary | ICD-10-CM | POA: Insufficient documentation

## 2013-08-27 ENCOUNTER — Other Ambulatory Visit: Payer: Self-pay | Admitting: Family Medicine

## 2013-08-27 DIAGNOSIS — Z1231 Encounter for screening mammogram for malignant neoplasm of breast: Secondary | ICD-10-CM

## 2013-09-23 ENCOUNTER — Ambulatory Visit: Payer: BC Managed Care – PPO

## 2013-10-03 ENCOUNTER — Encounter: Payer: Self-pay | Admitting: Family Medicine

## 2013-10-03 ENCOUNTER — Ambulatory Visit (INDEPENDENT_AMBULATORY_CARE_PROVIDER_SITE_OTHER): Payer: 59 | Admitting: Family Medicine

## 2013-10-03 VITALS — BP 106/70 | HR 72 | Temp 97.7°F | Resp 16 | Wt 153.8 lb

## 2013-10-03 DIAGNOSIS — M5137 Other intervertebral disc degeneration, lumbosacral region: Secondary | ICD-10-CM

## 2013-10-03 DIAGNOSIS — R143 Flatulence: Secondary | ICD-10-CM

## 2013-10-03 DIAGNOSIS — R140 Abdominal distension (gaseous): Secondary | ICD-10-CM

## 2013-10-03 DIAGNOSIS — R142 Eructation: Secondary | ICD-10-CM

## 2013-10-03 DIAGNOSIS — R141 Gas pain: Secondary | ICD-10-CM

## 2013-10-03 DIAGNOSIS — Z862 Personal history of diseases of the blood and blood-forming organs and certain disorders involving the immune mechanism: Secondary | ICD-10-CM

## 2013-10-03 DIAGNOSIS — Z0000 Encounter for general adult medical examination without abnormal findings: Secondary | ICD-10-CM

## 2013-10-03 DIAGNOSIS — M51379 Other intervertebral disc degeneration, lumbosacral region without mention of lumbar back pain or lower extremity pain: Secondary | ICD-10-CM | POA: Insufficient documentation

## 2013-10-03 LAB — COMPLETE METABOLIC PANEL WITH GFR
ALT: 20 U/L (ref 0–35)
AST: 18 U/L (ref 0–37)
Albumin: 4.6 g/dL (ref 3.5–5.2)
Alkaline Phosphatase: 56 U/L (ref 39–117)
BILIRUBIN TOTAL: 0.3 mg/dL (ref 0.2–1.2)
BUN: 13 mg/dL (ref 6–23)
CO2: 28 meq/L (ref 19–32)
Calcium: 9.5 mg/dL (ref 8.4–10.5)
Chloride: 102 mEq/L (ref 96–112)
Creat: 0.7 mg/dL (ref 0.50–1.10)
GFR, Est Non African American: >89 mL/min
Glucose, Bld: 88 mg/dL (ref 70–99)
Potassium: 4.1 mEq/L (ref 3.5–5.3)
SODIUM: 138 meq/L (ref 135–145)
TOTAL PROTEIN: 7.6 g/dL (ref 6.0–8.3)

## 2013-10-03 LAB — POCT URINALYSIS DIPSTICK
BILIRUBIN UA: NEGATIVE
GLUCOSE UA: NEGATIVE
Ketones, UA: NEGATIVE
LEUKOCYTES UA: NEGATIVE
NITRITE UA: NEGATIVE
Protein, UA: NEGATIVE
Spec Grav, UA: <=1.005
Urobilinogen, UA: 0.2
pH, UA: 6

## 2013-10-03 LAB — CBC WITH DIFFERENTIAL/PLATELET
Basophils Absolute: 0 10*3/uL (ref 0.0–0.1)
Basophils Relative: 1 % (ref 0–1)
EOS ABS: 0 10*3/uL (ref 0.0–0.7)
Eosinophils Relative: 1 % (ref 0–5)
HCT: 36.5 % (ref 36.0–46.0)
Hemoglobin: 13 g/dL (ref 12.0–15.0)
LYMPHS ABS: 1.8 10*3/uL (ref 0.7–4.0)
LYMPHS PCT: 42 % (ref 12–46)
MCH: 29.8 pg (ref 26.0–34.0)
MCHC: 35.6 g/dL (ref 30.0–36.0)
MCV: 83.7 fL (ref 78.0–100.0)
Monocytes Absolute: 0.4 10*3/uL (ref 0.1–1.0)
Monocytes Relative: 9 % (ref 3–12)
NEUTROS PCT: 47 % (ref 43–77)
Neutro Abs: 2 10*3/uL (ref 1.7–7.7)
PLATELETS: 277 10*3/uL (ref 150–400)
RBC: 4.36 MIL/uL (ref 3.87–5.11)
RDW: 13.9 % (ref 11.5–15.5)
WBC: 4.3 10*3/uL (ref 4.0–10.5)

## 2013-10-03 LAB — TSH: TSH: 1.114 u[IU]/mL (ref 0.350–4.500)

## 2013-10-03 LAB — T4, FREE: FREE T4: 1.19 ng/dL (ref 0.80–1.80)

## 2013-10-03 LAB — T3, FREE: T3, Free: 2.8 pg/mL (ref 2.3–4.2)

## 2013-10-03 MED ORDER — CYCLOBENZAPRINE HCL 5 MG PO TABS: 5.0000 mg | ORAL_TABLET | Freq: Three times a day (TID) | ORAL | Status: DC | PRN

## 2013-10-03 NOTE — Patient Instructions (Addendum)
Keeping You Healthy  Get These Tests 1. Blood Pressure- Have your blood pressure checked once a year by your health care provider.  Normal blood pressure is 120/80. 2. Weight- Have your body mass index (BMI) calculated to screen for obesity.  BMI is measure of body fat based on height and weight.  You can also calculate your own BMI at https://www.west-esparza.com/. 3. Cholesterol- Have your cholesterol checked every 5 years starting at age 44 then yearly starting at age 53. 4. Chlamydia, HIV, and other sexually transmitted diseases- Get screened every year until age 18, then within three months of each new sexual provider. 5. Pap Smear- Every 1-3 years; discuss with your health care provider. 6. Mammogram- Every year starting at age 38.  You have indicated that you do not want to have this imaging study at this time. I did not feel any abnormalities during your breast exam.  Take these medicines  Calcium with Vitamin D-Your body needs 1200 mg of Calcium each day and [phone] IU of Vitamin D daily.  Your body can only absorb 500 mg of Calcium at a time so Calcium must be taken in 2 or 3 divided doses throughout the day.  Multivitamin with folic acid- Once daily if it is possible for you to become pregnant.  Get these Immunizations  Tetanus shot- Every 10 years.  Flu shot-Every year.  Take these steps 1. Do not smoke-Your healthcare provider can help you quit.  For tips on how to quit go to www.smokefree.gov or call 1-800 QUITNOW. 2. Be physically active- Exercise 5 days a week for at least 30 minutes.  If you are not already physically active, start slow and gradually work up to 30 minutes of moderate physical activity.  Examples of moderate activity include walking briskly, dancing, swimming, bicycling, etc. 3. Breast Cancer- A self breast exam every month is important for early detection of breast cancer.  For more information and instruction on self breast exams, ask your healthcare provider  or [email or web address]. 4. Eat a healthy diet- Eat a variety of healthy foods such as fruits, vegetables, whole grains, low fat milk, low fat cheeses, yogurt, lean meats, poultry and fish, beans, nuts, tofu, etc.  For more information go to www. Thenutritionsource.org 5. Drink alcohol in moderation- Limit alcohol intake to one drink or less per day. Never drink and drive. 6. Depression- Your emotional health is as important as your physical health.  If you're feeling down or losing interest in things you normally enjoy please talk to your healthcare provider about being screened for depression. 7. Dental visit- Brush and floss your teeth twice daily; visit your dentist twice a year. 8. Eye doctor- Get an eye exam at least every 2 years. 9. Helmet use- Always wear a helmet when riding a bicycle, motorcycle, rollerblading or skateboarding. 10. Safe sex- If you may be exposed to sexually transmitted infections, use a condom. 11. Seat belts- Seat belts can save your live; always wear one. 12. Smoke/Carbon Monoxide detectors- These detectors need to be installed on the appropriate level of your home. Replace batteries at least once a year. 13. Skin cancer- When out in the sun please cover up and use sunscreen 15 SPF or higher. 14. Violence- If anyone is threatening or hurting you, please tell your healthcare provider.    Degenerative Disk Disease Degenerative disk disease is a condition caused by the changes that occur in the cushions of the backbone (spinal disks) as you grow older. Spinal disks are soft  and compressible disks located between the bones of the spine (vertebrae). They act like shock absorbers. Degenerative disk disease can affect the whole spine. However, the neck and lower back are most commonly affected. Many changes can occur in the spinal disks with aging, such as:  The spinal disks may dry and shrink.  Small tears may occur in the tough, outer covering of  the disk (annulus).  The disk space may become smaller due to loss of water.  Abnormal growths in the bone (spurs) may occur. This can put pressure on the nerve roots exiting the spinal canal, causing pain.  The spinal canal may become narrowed. CAUSES  Degenerative disk disease is a condition caused by the changes that occur in the spinal disks with aging. The exact cause is not known, but there is a genetic basis for many patients. Degenerative changes can occur due to loss of fluid in the disk. This makes the disk thinner and reduces the space between the backbones. Small cracks can develop in the outer layer of the disk. This can lead to the breakdown of the disk. You are more likely to get degenerative disk disease if you are overweight. Smoking cigarettes and doing heavy work such as weightlifting can also increase your risk of this condition. Degenerative changes can start after a sudden injury. Growth of bone spurs can compress the nerve roots and cause pain.  SYMPTOMS  The symptoms vary from person to person. Some people may have no pain, while others have severe pain. The pain may be so severe that it can limit your activities. The location of the pain depends on the part of your backbone that is affected. You will have neck or arm pain if a disk in the neck area is affected. You will have pain in your back, buttocks, or legs if a disk in the lower back is affected. The pain becomes worse while bending, reaching up, or with twisting movements. The pain may start gradually and then get worse as time passes. It may also start after a major or minor injury. You may feel numbness or tingling in the arms or legs.  DIAGNOSIS  Your caregiver will ask you about your symptoms and about activities or habits that may cause the pain. He or she may also ask about any injuries, diseases, or treatments you have had earlier. Your caregiver will examine you to check for the range of movement that is possible in  the affected area, to check for strength in your extremities, and to check for sensation in the areas of the arms and legs supplied by different nerve roots. An X-ray of the spine may be taken. Your caregiver may suggest other imaging tests, such as magnetic resonance imaging (MRI), if needed.  TREATMENT  Treatment includes rest, modifying your activities, and applying ice and heat. Your caregiver may prescribe medicines to reduce your pain and may ask you to do some exercises to strengthen your back. In some cases, you may need surgery. You and your caregiver will decide on the treatment that is best for you. HOME CARE INSTRUCTIONS   Follow proper lifting and walking techniques as advised by your caregiver.  Maintain good posture.  Exercise regularly as advised.  Perform relaxation exercises.  Change your sitting, standing, and sleeping habits as advised. Change positions frequently.  Lose weight as advised.  Stop smoking if you smoke.  Wear supportive footwear. SEEK MEDICAL CARE IF:  Your pain does not go away within 1 to  4 weeks. SEEK IMMEDIATE MEDICAL CARE IF:   Your pain is severe.  You notice weakness in your arms, hands, or legs.  You begin to lose control of your bladder or bowel movements. MAKE SURE YOU:   Understand these instructions.  Will watch your condition.  Will get help right away if you are not doing well or get worse. Document Released: 05/29/2007 Document Revised: 10/24/2011 Document Reviewed: 05/29/2007 [facility] Patient Information 2014 [place] [place].

## 2013-10-03 NOTE — Progress Notes (Signed)
Subjective:    Patient ID: Victoria CahillKedija Y Siefker, female    DOB: 02-03-1971, 43 y.o.   MRN: [id number]  HPI  This 43 y.o. Female is here w/ her husband to establish care. She c/o LBP and L hip pain w/ radiation down into lower leg. This is not a daily occurrence; onset about 6 months ago.  Treatment consist of exercises and better back care. Pt reports completion of PT as well as trial of acupuncture.  Pt has [facility] Shel[facility]Beane, [facility], for evaluation and reports that surgery was recommended. [facility] per pt's history; she has a CD-ROM.  Pt and husband request referral for second opinion.  HCM- GYN care with Dr.Howell; mammogram was ordered but pt cancelled appt because of insurance issues. She would like to defer that imaging study at this time.  Patient Active Problem List   Diagnosis Date Noted  . DDD (degenerative disc disease), lumbosacral 10/03/2013  . Irregular menses on the progesterone only minipill 09/02/2011  . ARTHRITIS, GENERALIZED 04/23/2007  . ABDOMINAL PAIN, CHRONIC 04/20/2007   MEDICATIONS reconciled.  Pt was taking OCPs but has discontinued that medication.  PMHx, Surg Hx, Soc and Fam Hx reviewed.   Review of Systems  HENT: Negative.   Eyes: Negative.   Respiratory: Negative.   Cardiovascular: Negative.   Gastrointestinal: Positive for constipation. Negative for nausea, vomiting, diarrhea and blood in stool.       Intermittent bloating not associated w/ any particular foods.  Endocrine: Negative.   Genitourinary: Positive for menstrual problem.  Musculoskeletal: Positive for back pain. Negative for arthralgias, gait problem and myalgias.  Skin: Positive for rash.  Allergic/Immunologic: Positive for environmental allergies.  Neurological: Negative.   Hematological: Negative.   Psychiatric/Behavioral: Negative.       Objective:   Physical Exam  Nursing note and vitals reviewed. Constitutional: Vital signs are normal. She appears  well-developed and well-nourished. No distress.  HENT:  Head: Normocephalic and atraumatic.  Right Ear: Hearing, tympanic membrane, external ear and ear canal normal.  Left Ear: Hearing, tympanic membrane, external ear and ear canal normal.  Nose: Nose normal. No nasal deformity or septal deviation.  Mouth/Throat: Uvula is midline, oropharynx is clear and moist and mucous membranes are normal. No oral lesions. Normal dentition.  Eyes: EOM and lids are normal. Pupils are equal, round, and reactive to light. No scleral icterus.  R conjunctiva- pterygium  extending over medial aspect of cornea.  Neck: Trachea normal, normal range of motion and full passive range of motion without pain. Neck supple. No spinous process tenderness and no muscular tenderness present. No mass and no thyromegaly present.  Cardiovascular: Normal rate, regular rhythm, S1 normal, S2 normal, normal heart sounds, intact distal pulses and normal pulses.   No extrasystoles are present. PMI is not displaced.  Exam reveals no gallop and no friction rub.   No murmur heard. Pulmonary/Chest: Effort normal and breath sounds normal. No respiratory distress. Right breast exhibits no inverted nipple, no mass, no nipple discharge, no skin change and no tenderness. Left breast exhibits no inverted nipple, no mass, no nipple discharge, no skin change and no tenderness. Breasts are symmetrical.  Abdominal: Soft. Normal appearance and bowel sounds are normal. She exhibits no distension and no mass. There is no hepatosplenomegaly. There is no tenderness. There is no guarding and no CVA tenderness. No hernia.  Genitourinary:  Deferred.  Musculoskeletal: Normal range of motion. She exhibits no edema and no tenderness.  Cervical back: Normal.       Thoracic back: Normal.       Lumbar back: She exhibits no bony tenderness, no deformity, no pain and no spasm.  Lymphadenopathy:       Head (right side): No submental, no submandibular, no  tonsillar, no posterior auricular and no occipital adenopathy present.       Head (left side): No submental, no submandibular, no tonsillar, no posterior auricular and no occipital adenopathy present.    She has no cervical adenopathy.    She has no axillary adenopathy.       Right: No inguinal and no supraclavicular adenopathy present.       Left: No inguinal and no supraclavicular adenopathy present.  Neurological: She is alert.  Skin: Skin is warm, dry and intact. No ecchymosis, no lesion and no rash noted. She is not diaphoretic. No cyanosis or erythema. Nails show no clubbing.  Psychiatric: She has a normal mood and affect. Her speech is normal and behavior is normal. Judgment and thought content normal. Cognition and memory are normal.     Results for orders placed in visit on 10/03/13  POCT URINALYSIS DIPSTICK      Result Value Ref Range   Color, UA pale     Clarity, UA clear     Glucose, UA neg     Bilirubin, UA neg     Ketones, UA neg     Spec Grav, UA <=1.005     Blood, UA trace     pH, UA 6.0     Protein, UA neg     Urobilinogen, UA 0.2     Nitrite, UA neg     Leukocytes, UA Negative        Assessment & Plan:  Routine general medical examination at a health care facility - Plan: POCT urinalysis dipstick, COMPLETE METABOLIC PANEL WITH GFR, TSH, T3, Free, T4, Free  DDD (degenerative disc disease), lumbosacral - Sept 2014 lumbar spine films show mild degenerative disk disease at L2-3, L3-4 and L4-5. No acute findings. Plan: Vit D  25 hydroxy, Ambulatory referral to Neurosurgery- pt and husband requests Dr. Trey Sailors who reportedly has an office in [place], [place].  Abdominal bloating - This is a chronic problem advised trial of Probiotics. Treated H. Pylori in 2008. May need retesting and retreatment.  Personal history of diseases of blood and blood-forming organs - Plan: CBC with Differential  Meds ordered this encounter  Medications  . cyclobenzaprine (FLEXERIL) 5 MG  tablet    Sig: Take 1 tablet (5 mg total) by mouth 3 (three) times daily as needed for muscle spasms.    Dispense:  42 tablet    Refill:  0

## 2013-10-04 LAB — VITAMIN D 25 HYDROXY (VIT D DEFICIENCY, FRACTURES): Vit D, 25-Hydroxy: 38 ng/mL (ref 30–89)

## 2013-10-04 NOTE — Progress Notes (Signed)
Quick Note:  Please notify pt that results are normal.   Provide pt with copy of labs. ______ 

## 2013-10-08 ENCOUNTER — Telehealth: Payer: Self-pay

## 2013-10-08 NOTE — Telephone Encounter (Signed)
Please call patient's husband ASAP. He has questions concerning his wife's visit with Dr. Audria NineMcPherson last week.     Thank You!!!

## 2013-10-09 NOTE — Telephone Encounter (Signed)
Spoke to husband. Confirmed that he has called and made Dr. Audria NineMcPherson the PCP. Gave pt husband the number to Dr. Channing [place]uttersoy in [place]. He is going to call and try to make the appointment as to not delay any longer.

## 2013-10-15 ENCOUNTER — Telehealth: Payer: Self-pay | Admitting: Radiology

## 2013-10-15 NOTE — Telephone Encounter (Signed)
Patients husband is asking about the referral for his wife. [facility], she indicates this is a compass referral, and they are waiting on the insurance to approve the referral to Dr Channing Muttersoy. I have advised him, he would like a call as soon as possible regarding this appointment and when it may be scheduled. [phone]

## 2013-10-17 NOTE — Telephone Encounter (Signed)
appt is scheduled for today

## 2013-12-06 ENCOUNTER — Telehealth: Payer: Self-pay | Admitting: *Deleted

## 2013-12-06 DIAGNOSIS — M5137 Other intervertebral disc degeneration, lumbosacral region: Secondary | ICD-10-CM

## 2013-12-06 NOTE — Telephone Encounter (Signed)
Patient's husband, Shirline FreesMohammed called stating that [facility] needs a referral from us. This is all I was able to understand from the conversation. Please call Mohammed at [phone]

## 2013-12-06 NOTE — Telephone Encounter (Signed)
LMVM to CB. 

## 2013-12-07 NOTE — Telephone Encounter (Signed)
Pt [facility]icks [facility]. Has another appt on 12/23/13 but their office is requesting another referral be done for the same problem.

## 2013-12-07 NOTE — Telephone Encounter (Signed)
Orders Placed This Encounter  °Procedures  ° Ambulatory referral to Orthopedic Surgery  °  Referral Priority:   Routine  °  Referral Type:   Surgical  °  Referral Reason:   Specialty Services Required  °  Requested Specialty:   Orthopedic Surgery  °  Number of Visits Requested:   1  °  °

## 2014-01-17 ENCOUNTER — Encounter: Payer: Self-pay | Admitting: Family Medicine

## 2014-01-17 ENCOUNTER — Ambulatory Visit (INDEPENDENT_AMBULATORY_CARE_PROVIDER_SITE_OTHER): Payer: 59 | Admitting: Family Medicine

## 2014-01-17 VITALS — BP 112/71 | HR 93 | Temp 97.8°F | Resp 18 | Ht 66.0 in | Wt 158.0 lb

## 2014-01-17 DIAGNOSIS — K5900 Constipation, unspecified: Secondary | ICD-10-CM

## 2014-01-17 DIAGNOSIS — K3189 Other diseases of stomach and duodenum: Secondary | ICD-10-CM

## 2014-01-17 DIAGNOSIS — B353 Tinea pedis: Secondary | ICD-10-CM

## 2014-01-17 DIAGNOSIS — K319 Disease of stomach and duodenum, unspecified: Secondary | ICD-10-CM

## 2014-01-17 DIAGNOSIS — R1013 Epigastric pain: Principal | ICD-10-CM

## 2014-01-17 MED ORDER — OMEPRAZOLE 20 MG PO CPDR: 20.0000 mg | DELAYED_RELEASE_CAPSULE | Freq: Every day | ORAL | Status: DC

## 2014-01-17 NOTE — Progress Notes (Signed)
Subjective:    Patient ID: [name], female    DOB: [birth year], 43 y.o.   MRN: [id number]  HPI  This 43 y.o. African female is here for evaluation of epigastric pain and chronic constipation. Abnormal bowel function dates back > 5 years. Pt describes stomach discomfort as "heaviness" and bloating. She has mild nausea but no vomiting.Her "digestion is not good". Diet consists mainly of vegetables and rice w/ little or no meat. Stools are "dry" and difficult to pass; stools are normal color and are not malodorous. No report of hematochezia or melena.  Pt has foot rash present for years; it is very itchy. The rash is not responsive to topical medication.  Patient Active Problem List   Diagnosis Date Noted  . DDD (degenerative disc disease), lumbosacral 10/03/2013  . Irregular menses on the progesterone only minipill 09/02/2011  . ARTHRITIS, GENERALIZED 04/23/2007  . ABDOMINAL PAIN, CHRONIC 04/20/2007   Prior to Admission medications   Medication Sig Start Date End Date Taking? Authorizing Provider  cyclobenzaprine (FLEXERIL) 5 MG tablet Take 1 tablet (5 mg total) by mouth 3 (three) times daily as needed for muscle spasms. 10/03/13  Yes Maurice March, MD  ibuprofen (ADVIL,MOTRIN) 600 MG tablet Take 1 tablet (600 mg total) by mouth every 8 (eight) hours as needed for pain. 04/15/13  Yes Cathlyn Parsons, NP  triamcinolone cream (KENALOG) 0.1 % Apply topically 2 (two) times daily. Apply for 2 weeks. May use on face 10/11/12   Hayden Rasmussen, NP   PMHx, Surg Hx, Soc and Fam Hx reviewed.   Review of Systems  Constitutional: Positive for appetite change and fatigue. Negative for fever, chills, diaphoresis and unexpected weight change.  HENT: Negative for sore throat, trouble swallowing and voice change.   Respiratory: Negative.   Cardiovascular: Negative.   Gastrointestinal: Positive for nausea, abdominal pain, constipation and abdominal distention. Negative for vomiting, diarrhea and blood  in stool.  Endocrine: Negative.   Genitourinary: Negative for flank pain, difficulty urinating and pelvic pain.  Neurological: Negative.       Objective:   Physical Exam  Nursing note and vitals reviewed. Constitutional: She is oriented to person, place, and time. She appears well-developed and well-nourished. No distress.  HENT:  Head: Normocephalic and atraumatic.  Right Ear: External ear normal.  Left Ear: External ear normal.  Nose: Nose normal.  Mouth/Throat: Oropharynx is clear and moist. No oropharyngeal exudate.  Eyes: Conjunctivae and EOM are normal. Pupils are equal, round, and reactive to light. No scleral icterus.  Neck: Normal range of motion. Neck supple. No thyromegaly present.  Cardiovascular: Normal rate, regular rhythm and normal heart sounds.   Pulmonary/Chest: Effort normal and breath sounds normal. No respiratory distress.  Abdominal: Soft. Normal appearance and bowel sounds are normal. She exhibits no distension and no mass. There is no hepatosplenomegaly. There is tenderness. There is no rebound, no guarding and no CVA tenderness.  Musculoskeletal: Normal range of motion. She exhibits no edema and no tenderness.  Neurological: She is alert and oriented to person, place, and time. No cranial nerve deficit. Coordination normal.  Skin: Skin is warm and dry. Rash noted. She is not diaphoretic.  L foot: web space between 4th-5th digits- thick white tissue of depth > 70mm.  Psychiatric: She has a normal mood and affect. Her behavior is normal. Judgment and thought content normal.      Assessment & Plan:  Dyspepsia and disorder of function of stomach - Trial Prilosec pending lab  results. Plan: H. pylori antibody, IgG  Unspecified constipation - Dietary modifications advised. May need referral for colonoscopy.       Plan: H. pylori antibody, IgG  Tinea pedis, recurrent - Plan: Ambulatory referral to Podiatry

## 2014-01-17 NOTE — Patient Instructions (Signed)
You have print information about ways to treat constipation. I have advised that you try BENEFIBER once a day as well as continue to work on diet. If these measures do ot help, you will need to be evaluated by a GI specialist.

## 2014-01-20 LAB — H. PYLORI ANTIBODY, IGG: H PYLORI IGG: 0.6 {ISR}

## 2014-01-22 NOTE — Progress Notes (Signed)
Quick Note:  Please advise pt regarding following labs... The blood test for the stomach bacteria/infection is negative. Continue taking the medication I have prescribed. No additional medication is needed at this time.  As I mentioned at your visit, you may want to consider seeing a GI specialist (a physician who treats disorders of the digestive tract). ______

## 2014-02-03 ENCOUNTER — Encounter: Payer: Self-pay | Admitting: Podiatry

## 2014-02-03 ENCOUNTER — Ambulatory Visit (INDEPENDENT_AMBULATORY_CARE_PROVIDER_SITE_OTHER): Payer: 59 | Admitting: Podiatry

## 2014-02-03 VITALS — BP 102/60 | HR 70 | Resp 16

## 2014-02-03 DIAGNOSIS — B351 Tinea unguium: Secondary | ICD-10-CM

## 2014-02-03 DIAGNOSIS — L259 Unspecified contact dermatitis, unspecified cause: Secondary | ICD-10-CM

## 2014-02-03 MED ORDER — TERBINAFINE HCL 250 MG PO TABS: 250.0000 mg | ORAL_TABLET | Freq: Every day | ORAL | Status: DC

## 2014-02-03 NOTE — Progress Notes (Signed)
   Subjective:    Patient ID: Victoria CahillKedija Y Pennington, female    DOB: 01/10/1971, 43 y.o.   MRN: [id number]  HPI Comments: "I have this stuff on the nails"  Patient c/o itching around and in between toes bilateral. The skin is scaly and peeling. The toenails are thick and discolored and some are brittle. She has been using cream that was Rx'd by PCP, but not helping.      Review of Systems  Skin: Positive for rash.       Change in nails  All other systems reviewed and are negative.      Objective:   Physical Exam        Assessment & Plan:

## 2014-02-03 NOTE — Progress Notes (Signed)
Subjective:     Patient ID: [name], female   DOB: 08/30/1970, 43 y.o.   MRN: [id number]  HPI patient presents with husband with thick nail disease 1-5 of both feet and maceration occurring between the third and fourth and fourth and fifth toes of both feet that becomes irritated for her   Review of Systems  All other systems reviewed and are negative.      Objective:   Physical Exam  Nursing note and vitals reviewed. Constitutional: She is oriented to person, place, and time.  Cardiovascular: Intact distal pulses.   Musculoskeletal: Normal range of motion.  Neurological: She is oriented to person, place, and time.  Skin: Skin is warm.   neurovascular status is intact with range of motion subtalar midtarsal joint adequate and muscle strength within normal limits. Patient has thick friable brittle nailbeds 1-5 of both feet and white discoloration between the third and fourth and fourth and fifth toes of both feet     Assessment:     Mycotic infection of nailbeds and skin    Plan:     Reviewed condition with family and discussed oral Lamisil treatment. They want this understanding risk and stating she had liver function studies done 2 weeks ago which were normal and today we'll start her on Lamisil 90 pills 250 mg daily with instructions. It to cause any problems. Also begin topical treatment

## 2014-02-03 NOTE — Patient Instructions (Signed)

## 2014-06-05 ENCOUNTER — Ambulatory Visit: Payer: 59 | Admitting: Family Medicine

## 2014-06-16 ENCOUNTER — Encounter: Payer: Self-pay | Admitting: Podiatry

## 2014-06-18 ENCOUNTER — Ambulatory Visit (INDEPENDENT_AMBULATORY_CARE_PROVIDER_SITE_OTHER): Payer: 59 | Admitting: Family Medicine

## 2014-06-18 ENCOUNTER — Ambulatory Visit: Payer: 59 | Admitting: Family Medicine

## 2014-06-18 ENCOUNTER — Other Ambulatory Visit: Payer: Self-pay

## 2014-06-18 ENCOUNTER — Encounter: Payer: Self-pay | Admitting: Family Medicine

## 2014-06-18 VITALS — BP 100/66 | HR 82 | Temp 97.6°F | Resp 16 | Ht 65.0 in | Wt 158.8 lb

## 2014-06-18 DIAGNOSIS — R1013 Epigastric pain: Secondary | ICD-10-CM

## 2014-06-18 DIAGNOSIS — L309 Dermatitis, unspecified: Secondary | ICD-10-CM

## 2014-06-18 DIAGNOSIS — K30 Functional dyspepsia: Secondary | ICD-10-CM

## 2014-06-18 DIAGNOSIS — K319 Disease of stomach and duodenum, unspecified: Secondary | ICD-10-CM

## 2014-06-18 MED ORDER — CETIRIZINE HCL 10 MG PO TABS: 10.0000 mg | ORAL_TABLET | Freq: Every day | ORAL | Status: DC

## 2014-06-18 MED ORDER — TRIAMCINOLONE ACETONIDE 0.1 % EX CREA: TOPICAL_CREAM | Freq: Two times a day (BID) | CUTANEOUS | Status: DC

## 2014-06-18 NOTE — Telephone Encounter (Signed)
First Rx printed instead of going electronically. I am resending.

## 2014-06-18 NOTE — Progress Notes (Signed)
This young woman is here w/ her husband for evaluation of a rash; it seems to itch when weather is hot and humid. Topical steroid cream is effective; she feels well otherwise; no fever/chills, rhinorrhea, sore throat, eye redness, n/v/d, other rashes or skin changes, GU symptoms, myalgias, HA or weakness.  I agree with examination, assessment and plan of care for this pt. Nothing to add.  She will RTC in 3-4 months for CPE, health maintenance discussion and other care as warranted.   Maurice MarchBarbara B. Daryel Kenneth,  MD [facility]

## 2014-06-18 NOTE — Progress Notes (Signed)
   Subjective:    Patient ID: [name], female    DOB: August 05, 1971, 43 y.o.   MRN: [id number]  HPI Patient presents with rash of the elbows, neck, face, and L knee that has been present 3-4 weeks. Has been treated in the past for eczema and using same cream from then. Would like a refill to continue to treatment as she ran out of medication 1 week ago. Rash itches, but is not painful, erythematous, or accompanied with fever.  Last visit, was started on prilosec for epigastric pain and says medication is well tolerated. No longer having any pain and has adjusted diet.   Insurance nurse came to house and gave forms for health maintenance. Would like to know what our recommendations are.   Review of Systems  Constitutional: Negative for fever.  HENT: Negative for rhinorrhea and sneezing.   Eyes: Negative for itching.  Respiratory: Negative for shortness of breath and wheezing.   Skin: Positive for rash (on elbows, neck, face, knee). Negative for color change, pallor and wound.  Allergic/Immunologic: Negative for environmental allergies and food allergies.       Objective:   Physical Exam  Constitutional: She is oriented to person, place, and time. She appears well-developed and well-nourished. No distress.  Blood pressure 100/66, pulse 82, temperature 97.6 F (36.4 C), temperature source Oral, resp. rate 16, height 5\' 5"  (1.651 m), weight 158 lb 12.8 oz (72.031 kg), last menstrual period 05/02/2014, SpO2 97 %.   HENT:  Head: Normocephalic and atraumatic.  Eyes: Left eye exhibits no discharge.  Neck: Neck supple.  Cardiovascular: Normal rate, regular rhythm and normal heart sounds.  Exam reveals no gallop and no friction rub.   No murmur heard. Pulmonary/Chest: Effort normal and breath sounds normal. She has no wheezes. She has no rales.  Abdominal: Soft. Bowel sounds are normal. She exhibits no distension. There is no tenderness.  Lymphadenopathy:    She has no cervical  adenopathy.  Neurological: She is alert and oriented to person, place, and time.  Skin: Skin is warm and dry. Rash (improving eczema; hyperpigmented, non-erythematous, non-scaly; on flexures of boths elbows, right side of neck, minimal at harirline, side on left knee) noted. She is not diaphoretic. No erythema. No pallor.        Assessment & Plan:  1. Eczema Improving.  - triamcinolone cream (KENALOG) 0.1 %; Apply topically 2 (two) times daily. Apply for 2 weeks. May use on face sparingly.  Dispense: 30 g; Refill: 0 - cetirizine (ZYRTEC) 10 MG tablet; Take 1 tablet (10 mg total) by mouth daily.  Dispense: 30 tablet; Refill: 11 - Encouraged to stay moisturized and take shorter showers.  2. Dyspepsia Resolved with medication.    Janan Ridgeishira Cicely Ortner PA-C  [facility] 06/18/2014 11:50 AM

## 2014-06-18 NOTE — Patient Instructions (Signed)
Eczema Eczema, also called atopic dermatitis, is a skin disorder that causes inflammation of the skin. It causes a red rash and dry, scaly skin. The skin becomes very itchy. Eczema is generally worse during the cooler winter months and often improves with the warmth of summer. Eczema usually starts showing signs in infancy. Some children outgrow eczema, but it may last through adulthood.  CAUSES  The exact cause of eczema is not known, but it appears to run in families. People with eczema often have a family history of eczema, allergies, asthma, or hay fever. Eczema is not contagious. Flare-ups of the condition may be caused by:   Contact with something you are sensitive or allergic to.   Stress. SIGNS AND SYMPTOMS  Dry, scaly skin.   Red, itchy rash.   Itchiness. This may occur before the skin rash and may be very intense.  DIAGNOSIS  The diagnosis of eczema is usually made based on symptoms and medical history. TREATMENT  Eczema cannot be cured, but symptoms usually can be controlled with treatment and other strategies. A treatment plan might include:  Controlling the itching and scratching.   Use over-the-counter antihistamines as directed for itching. This is especially useful at night when the itching tends to be worse.   Use over-the-counter steroid creams as directed for itching.   Avoid scratching. Scratching makes the rash and itching worse. It may also result in a skin infection (impetigo) due to a break in the skin caused by scratching.   Keeping the skin well moisturized with creams every day. This will seal in moisture and help prevent dryness. Lotions that contain alcohol and water should be avoided because they can dry the skin.   Limiting exposure to things that you are sensitive or allergic to (allergens).   Recognizing situations that cause stress.   Developing a plan to manage stress.  HOME CARE INSTRUCTIONS   Only take over-the-counter or  prescription medicines as directed by your health care provider.   Do not use anything on the skin without checking with your health care provider.   Keep baths or showers short (5 minutes) in warm (not hot) water. Use mild cleansers for bathing. These should be unscented. You may add nonperfumed bath oil to the bath water. It is best to avoid soap and bubble bath.   Immediately after a bath or shower, when the skin is still damp, apply a moisturizing ointment to the entire body. This ointment should be a petroleum ointment. This will seal in moisture and help prevent dryness. The thicker the ointment, the better. These should be unscented.   Keep fingernails cut short. Children with eczema may need to wear soft gloves or mittens at night after applying an ointment.   Dress in clothes made of cotton or cotton blends. Dress lightly, because heat increases itching.   A child with eczema should stay away from anyone with fever blisters or cold sores. The virus that causes fever blisters (herpes simplex) can cause a serious skin infection in children with eczema. SEEK MEDICAL CARE IF:   Your itching interferes with sleep.   Your rash gets worse or is not better within 1 week after starting treatment.   You see pus or soft yellow scabs in the rash area.   You have a fever.   You have a rash flare-up after contact with someone who has fever blisters.  Document Released: 07/29/2000 Document Revised: 05/22/2013 Document Reviewed: 03/04/2013 Exit[employer] Patient Information 2015 [employer]. This information   is not intended to replace advice given to you by your health care provider. Make sure you discuss any questions you have with your health care provider.  

## 2014-06-26 ENCOUNTER — Ambulatory Visit: Payer: 59 | Admitting: Family Medicine

## 2014-10-08 ENCOUNTER — Encounter: Payer: 59 | Admitting: Family Medicine

## 2015-01-14 ENCOUNTER — Ambulatory Visit (INDEPENDENT_AMBULATORY_CARE_PROVIDER_SITE_OTHER): Payer: 59 | Admitting: Family Medicine

## 2015-01-14 VITALS — BP 109/74 | HR 66 | Temp 97.3°F | Resp 16 | Ht 64.75 in | Wt 159.8 lb

## 2015-01-14 DIAGNOSIS — J209 Acute bronchitis, unspecified: Secondary | ICD-10-CM

## 2015-01-14 DIAGNOSIS — R05 Cough: Secondary | ICD-10-CM

## 2015-01-14 DIAGNOSIS — J069 Acute upper respiratory infection, unspecified: Secondary | ICD-10-CM | POA: Diagnosis not present

## 2015-01-14 DIAGNOSIS — R059 Cough, unspecified: Secondary | ICD-10-CM

## 2015-01-14 DIAGNOSIS — B9789 Other viral agents as the cause of diseases classified elsewhere: Secondary | ICD-10-CM

## 2015-01-14 MED ORDER — HYDROCOD POLST-CPM POLST ER 10-8 MG/5ML PO SUER: 5.0000 mL | Freq: Two times a day (BID) | ORAL | Status: DC | PRN

## 2015-01-14 NOTE — Progress Notes (Signed)
   Subjective:    Patient ID: Victoria CahillKedija Y Mcmanamon, female    DOB: Dec 20, 1970, 44 y.o.   MRN: [id number]  HPI This is a very pleasant 44 yo female who is accompanied by her husband who helps her some with interpreting. Patient presents today with 7 day history of cough. Sometimes productive of thick yellow-white sputum.  Used husband's inhaler without improvement. Tried some Delsym with minimal relief.  Never smoked, no history of asthma. Husband has been sick as well.  Past Medical History  Diagnosis Date  . Eczema   . Neuromuscular disorder    Past Surgical History  Procedure Laterality Date  . Cesarean section      one section    No family history on file. History  Substance Use Topics  . Smoking status: Never Smoker   . Smokeless tobacco: Never Used  . Alcohol Use: No    Review of Systems Chills at onset, no fever, cough keeping her awake at night, some soreness in chest, no headache, some ST.     Objective:   Physical Exam Physical Exam  Constitutional: Oriented to person, place, and time. She appears well-developed and well-nourished. Frequent,nonproductive cough.  HENT:  Head: Normocephalic and atraumatic.  Eyes: Conjunctivae are normal.  Neck: Normal range of motion. Neck supple.  Cardiovascular: Normal rate, regular rhythm and normal heart sounds.   Pulmonary/Chest: Effort normal and breath sounds normal.  Musculoskeletal: Normal range of motion.  Neurological: Alert and oriented to person, place, and time.  Skin: Skin is warm and dry.  Psychiatric: Normal mood and affect. Behavior is normal. Judgment and thought content normal.  Vitals reviewed.  BP 109/74 mmHg  Pulse 66  Temp(Src) 97.3 F (36.3 C) (Oral)  Resp 16  Ht 5' 4.75" (1.645 m)  Wt 159 lb 12.8 oz (72.485 kg)  BMI 26.79 kg/m2  SpO2 100%  LMP 05/02/2014     Assessment & Plan:  1. Acute bronchitis, unspecified organism - suspect viral illness in patient with no history asthma or smoking -  supportive care- mucinex, fluids, rest - RTC if no improvement in 5-6 days, sooner if worsening symptoms, fever over 101, increased SOB. - patient and husband verbalized understanding  2. Cough - chlorpheniramine-HYDROcodone (TUSSIONEX PENNKINETIC ER) 10-8 MG/5ML SUER; Take 5 mLs by mouth every 12 (twelve) hours as needed for cough.  Dispense: 115 mL; Refill: 0   Olean Reeeborah Gessner, FNP-BC  [facility]  01/14/2015 8:55 AM

## 2015-01-14 NOTE — Patient Instructions (Signed)
Return to clinic if not better in 5-6 days, sooner if you feel worse, develop fever over 101 degrees, or increased shortness of breath. Drink at least 1 cup of fluids every hour while awake Can take over the counter acetaminophen or ibuprofen for muscle aches  Cough, Adult  A cough is a reflex that helps clear your throat and airways. It can help heal the body or may be a reaction to an irritated airway. A cough may only last 2 or 3 weeks (acute) or may last more than 8 weeks (chronic).  CAUSES Acute cough:  Viral or bacterial infections. Chronic cough:  Infections.  Allergies.  Asthma.  Post-nasal drip.  Smoking.  Heartburn or acid reflux.  Some medicines.  Chronic lung problems (COPD).  Cancer. SYMPTOMS   Cough.  Fever.  Chest pain.  Increased breathing rate.  High-pitched whistling sound when breathing (wheezing).  Colored mucus that you cough up (sputum). TREATMENT   A bacterial cough may be treated with antibiotic medicine.  A viral cough must run its course and will not respond to antibiotics.  Your caregiver may recommend other treatments if you have a chronic cough. HOME CARE INSTRUCTIONS   Only take over-the-counter or prescription medicines for pain, discomfort, or fever as directed by your caregiver. Use cough suppressants only as directed by your caregiver.  Use a cold steam vaporizer or humidifier in your bedroom or home to help loosen secretions.  Sleep in a semi-upright position if your cough is worse at night.  Rest as needed.  Stop smoking if you smoke. SEEK IMMEDIATE MEDICAL CARE IF:   You have pus in your sputum.  Your cough starts to worsen.  You cannot control your cough with suppressants and are losing sleep.  You begin coughing up blood.  You have difficulty breathing.  You develop pain which is getting worse or is uncontrolled with medicine.  You have a fever. MAKE SURE YOU:   Understand these instructions.  Will  watch your condition.  Will get help right away if you are not doing well or get worse. Document Released: 01/28/2011 Document Revised: 10/24/2011 Document Reviewed: 01/28/2011 [facility] Patient Information 2015 [employer], [place]. This information is not intended to replace advice given to you by your health care provider. Make sure you discuss any questions you have with your health care provider.

## 2015-04-06 ENCOUNTER — Ambulatory Visit (INDEPENDENT_AMBULATORY_CARE_PROVIDER_SITE_OTHER): Payer: 59 | Admitting: Family Medicine

## 2015-04-06 ENCOUNTER — Encounter: Payer: Self-pay | Admitting: Family Medicine

## 2015-04-06 VITALS — BP 123/74 | HR 74 | Temp 97.8°F | Resp 16 | Ht 65.0 in | Wt 156.2 lb

## 2015-04-06 DIAGNOSIS — L309 Dermatitis, unspecified: Secondary | ICD-10-CM

## 2015-04-06 DIAGNOSIS — Z0000 Encounter for general adult medical examination without abnormal findings: Secondary | ICD-10-CM

## 2015-04-06 DIAGNOSIS — Z130 Encounter for screening for diseases of the blood and blood-forming organs and certain disorders involving the immune mechanism: Secondary | ICD-10-CM | POA: Diagnosis not present

## 2015-04-06 DIAGNOSIS — Z139 Encounter for screening, unspecified: Secondary | ICD-10-CM | POA: Diagnosis not present

## 2015-04-06 DIAGNOSIS — Z124 Encounter for screening for malignant neoplasm of cervix: Secondary | ICD-10-CM

## 2015-04-06 DIAGNOSIS — E559 Vitamin D deficiency, unspecified: Secondary | ICD-10-CM

## 2015-04-06 DIAGNOSIS — Z131 Encounter for screening for diabetes mellitus: Secondary | ICD-10-CM | POA: Diagnosis not present

## 2015-04-06 DIAGNOSIS — Z1329 Encounter for screening for other suspected endocrine disorder: Secondary | ICD-10-CM

## 2015-04-06 DIAGNOSIS — Z1389 Encounter for screening for other disorder: Secondary | ICD-10-CM | POA: Diagnosis not present

## 2015-04-06 LAB — CBC
HEMATOCRIT: 37.3 % (ref 36.0–46.0)
HEMOGLOBIN: 12.8 g/dL (ref 12.0–15.0)
MCH: 28.8 pg (ref 26.0–34.0)
MCHC: 34.3 g/dL (ref 30.0–36.0)
MCV: 83.8 fL (ref 78.0–100.0)
MPV: 9.1 fL (ref 8.6–12.4)
Platelets: 266 10*3/uL (ref 150–400)
RBC: 4.45 MIL/uL (ref 3.87–5.11)
RDW: 14 % (ref 11.5–15.5)
WBC: 4.6 10*3/uL (ref 4.0–10.5)

## 2015-04-06 LAB — HEMOGLOBIN A1C
Hgb A1c MFr Bld: 5.9 % — ABNORMAL HIGH (ref <5.7)
MEAN PLASMA GLUCOSE: 123 mg/dL — AB (ref <117)

## 2015-04-06 LAB — COMPREHENSIVE METABOLIC PANEL
ALBUMIN: 4.4 g/dL (ref 3.6–5.1)
ALK PHOS: 72 U/L (ref 33–115)
ALT: 21 U/L (ref 6–29)
AST: 22 U/L (ref 10–35)
BILIRUBIN TOTAL: 0.5 mg/dL (ref 0.2–1.2)
BUN: 12 mg/dL (ref 7–25)
CO2: 30 mmol/L (ref 20–31)
CREATININE: 0.55 mg/dL (ref 0.50–1.10)
Calcium: 9.5 mg/dL (ref 8.6–10.2)
Chloride: 103 mmol/L (ref 98–110)
Glucose, Bld: 94 mg/dL (ref 65–99)
Potassium: 4 mmol/L (ref 3.5–5.3)
SODIUM: 143 mmol/L (ref 135–146)
Total Protein: 7.9 g/dL (ref 6.1–8.1)

## 2015-04-06 LAB — TSH: TSH: 1.029 u[IU]/mL (ref 0.350–4.500)

## 2015-04-06 MED ORDER — HYDROCORTISONE 2.5 % EX LOTN: TOPICAL_LOTION | Freq: Two times a day (BID) | CUTANEOUS | Status: DC

## 2015-04-06 NOTE — Progress Notes (Signed)
Subjective:    Patient ID: [name], female    DOB: [birth year], 44 y.o.   MRN: [id number]  HPI This is a pleasant 44 yo female who presents today for CPE.  Last CPE- 2/15 Mammo- she has never had, she declines screening when offered Pap-12/12 Tdap- 2008 Flu- declines Eye- not regular Dental- not regular Exercise- walks several times per week  Past Medical History  Diagnosis Date  . Eczema   . Neuromuscular disorder    Past Surgical History  Procedure Laterality Date  . Cesarean section      one section    No family history on file.- mother alive and healthy, lives in [place]. Father died in his 91s.  Social History  Substance Use Topics  . Smoking status: Never Smoker   . Smokeless tobacco: Never Used  . Alcohol Use: No    Review of Systems  Constitutional: Negative.   HENT: Negative.   Eyes: Negative.   Respiratory: Negative.   Cardiovascular: Negative.   Endocrine: Negative.   Musculoskeletal: Positive for back pain (rare back pain that resolves with rest).  Skin: Positive for rash (long standing history of exzema on arms and now neck, has used steroid cream in past with some improvement).  Allergic/Immunologic: Negative.   Neurological: Negative.   Hematological: Negative.   Psychiatric/Behavioral: Negative.       Objective:   Physical Exam  Physical Exam  Constitutional: She is oriented to person, place, and time. She appears well-developed and well-nourished. No distress.  HENT:  Head: Normocephalic and atraumatic.  Right Ear: External ear normal.  Left Ear: External ear normal.  Nose: Nose normal.  Mouth/Throat: Oropharynx is clear and moist. No oropharyngeal exudate.  Eyes: Conjunctivae are normal. Pupils are equal, round, and reactive to light.  Neck: Normal range of motion. Neck supple. No JVD present. No thyromegaly present.  Cardiovascular: Normal rate, regular rhythm, normal heart sounds and intact distal pulses.   Pulmonary/Chest:  Effort normal and breath sounds normal. Right breast exhibits no inverted nipple, no mass, no nipple discharge, no skin change and no tenderness. Left breast exhibits no inverted nipple, no mass, no nipple discharge, no skin change and no tenderness. Breasts are symmetrical.  Abdominal: Soft. Bowel sounds are normal. She exhibits no distension and no mass. There is no tenderness. There is no rebound and no guarding.  Genitourinary: Vagina normal. Pelvic exam was performed with patient supine. There is no rash, tenderness, lesion or injury on the right labia. There is no rash, tenderness, lesion or injury on the left labia. Cervix exhibits no discharge. No vaginal discharge found.  Musculoskeletal: Normal range of motion. She exhibits no edema or tenderness.  Lymphadenopathy:    She has no cervical adenopathy.  Neurological: She is alert and oriented to person, place, and time. She has normal reflexes.  Skin: Skin is warm and dry. She is not diaphoretic. Bilateral arms with multiple areas hyperpigmentation and thickened skin. Left side of neck with approximately 2 cm area same appearance.  Psychiatric: She has a normal mood and affect. Her behavior is normal. Judgment and thought content normal.  Vitals reviewed.  BP 123/74 mmHg  Pulse 74  Temp(Src) 97.8 F (36.6 C) (Oral)  Resp 16  Ht 5\' 5"  (1.651 m)  Wt 156 lb 3.2 oz (70.852 kg)  BMI 25.99 kg/m2  SpO2 100%  LMP 05/02/2014 Wt Readings from Last 3 Encounters:  04/06/15 156 lb 3.2 oz (70.852 kg)  01/14/15 159 lb 12.8 oz (  72.485 kg)  06/18/14 158 lb 12.8 oz (72.031 kg)       Assessment & Plan:  1. Annual physical exam -encouraged regular exercise, good sleep habits, discussed screening mammogram- patient refused  2. Screening for deficiency anemia - CBC  3. Screening for nephropathy - Comprehensive metabolic panel  4. Screening for cervical cancer - Pap IG, CT/NG w/ reflex HPV when ASC-U([facility])  5. Screening for  thyroid disorder - TSH  6. Screening for hematuria or proteinuria - POCT urinalysis dipstick  7. Vitamin D deficiency - Vit D  25 hydroxy (rtn osteoporosis monitoring)  8. Screening for diabetes mellitus - Hemoglobin A1c  9. Eczema - offered derm referral since this is a poorly controlled, chronic problem. Patient wanted to try steroid first.  - hydrocortisone 2.5 % lotion; Apply topically 2 (two) times daily. Use sparingly for maximum of 14 days  Dispense: 118 mL; Refill: 0  Olean Ree, FNP-BC  [facility]  04/09/2015 10:20 AM

## 2015-04-07 LAB — VITAMIN D 25 HYDROXY (VIT D DEFICIENCY, FRACTURES): VIT D 25 HYDROXY: 20 ng/mL — AB (ref 30–100)

## 2015-04-08 LAB — PAP IG, CT-NG, RFX HPV ASCU
Chlamydia Probe Amp: NEGATIVE
GC PROBE AMP: NEGATIVE

## 2015-04-11 ENCOUNTER — Encounter: Payer: Self-pay | Admitting: Family Medicine

## 2015-10-08 ENCOUNTER — Encounter: Payer: 59 | Admitting: Family Medicine

## 2016-07-12 ENCOUNTER — Telehealth: Payer: Self-pay | Admitting: Family Medicine

## 2016-07-12 ENCOUNTER — Ambulatory Visit (INDEPENDENT_AMBULATORY_CARE_PROVIDER_SITE_OTHER): Payer: BLUE CROSS/BLUE SHIELD | Admitting: Family Medicine

## 2016-07-12 VITALS — BP 108/60 | HR 78 | Temp 98.0°F | Ht 64.0 in | Wt 129.4 lb

## 2016-07-12 DIAGNOSIS — J0110 Acute frontal sinusitis, unspecified: Secondary | ICD-10-CM

## 2016-07-12 DIAGNOSIS — B351 Tinea unguium: Secondary | ICD-10-CM

## 2016-07-12 DIAGNOSIS — R140 Abdominal distension (gaseous): Secondary | ICD-10-CM | POA: Diagnosis not present

## 2016-07-12 LAB — COMPLETE METABOLIC PANEL WITH GFR
ALT: 13 U/L (ref 6–29)
AST: 16 U/L (ref 10–35)
Albumin: 4.2 g/dL (ref 3.6–5.1)
Alkaline Phosphatase: 46 U/L (ref 33–115)
BUN: 17 mg/dL (ref 7–25)
CHLORIDE: 104 mmol/L (ref 98–110)
CO2: 31 mmol/L (ref 20–31)
Calcium: 9.5 mg/dL (ref 8.6–10.2)
Creat: 0.52 mg/dL (ref 0.50–1.10)
GLUCOSE: 93 mg/dL (ref 65–99)
POTASSIUM: 4.2 mmol/L (ref 3.5–5.3)
SODIUM: 140 mmol/L (ref 135–146)
Total Bilirubin: 0.4 mg/dL (ref 0.2–1.2)
Total Protein: 7.3 g/dL (ref 6.1–8.1)

## 2016-07-12 MED ORDER — RANITIDINE HCL 150 MG PO TABS: ORAL_TABLET | ORAL | 0 refills | Status: DC

## 2016-07-12 MED ORDER — TERBINAFINE HCL 250 MG PO TABS: 250.0000 mg | ORAL_TABLET | Freq: Every day | ORAL | 0 refills | Status: DC

## 2016-07-12 MED ORDER — BENZONATATE 100 MG PO CAPS: 100.0000 mg | ORAL_CAPSULE | Freq: Three times a day (TID) | ORAL | 0 refills | Status: DC | PRN

## 2016-07-12 MED ORDER — AMOXICILLIN-POT CLAVULANATE 875-125 MG PO TABS: 1.0000 | ORAL_TABLET | Freq: Two times a day (BID) | ORAL | 0 refills | Status: DC

## 2016-07-12 NOTE — Progress Notes (Signed)
Patient ID: Victoria CahillKedija Y Pennington, female    DOB: [birth year], 45 y.o.   MRN: [id number]  PCP: Dow AdolphMCPHERSON,BARBARA, MD  Chief Complaint  Patient presents with  . Nausea    vomiting,bloating x 4 days and cough and headache    Subjective:   HPI 45 year old female presents for evaluation of cough, headache, sore throat, 4-5 days. She would also like to discuss toe nail fungus and abdominal bloating with nausea. Accompanied by husband. [facility].  Cough and Headache Sneezing, congestion of sinus, and headache x 4-5 days. Increasingly worsening sinus pressure.  She hasn't taken any medication. Denies fever. Reports cough as occasionally productive. Sore throat, due to sensation of drainage in back of throat.  Bloating and Nausea Occurs mostly after meals. No loose stools. Denies burning sensation. Notices an increase frequency of flatulence. Denies abdominal burning or pain.  Fungus-Toenail  Several year hx of toe nail itching and fungus. Now toenails are turning black. Very dry, between the toes. She has never taken medication for this condition or seen a podiatrist  Social History   Social History  . Marital status: Married    Spouse name: N/A  . Number of children: N/A  . Years of education: N/A   Occupational History  . Not on file.   Social History Main Topics  . Smoking status: Never Smoker  . Smokeless tobacco: Never Used  . Alcohol use No  . Drug use: No  . Sexual activity: Yes    Birth control/ protection: Pill   Other Topics Concern  . Not on file   Social History Narrative  . No narrative on file    No family history on file.   Review of Systems  See HPI  Patient Active Problem List   Diagnosis Date Noted  . Eczema 06/18/2014  . DDD (degenerative disc disease), lumbosacral 10/03/2013  . Avitaminosis D 07/31/2013  . Irregular menses on the progesterone only minipill 09/02/2011  . ARTHRITIS, GENERALIZED 04/23/2007  . ABDOMINAL PAIN,  CHRONIC 04/20/2007     Prior to Admission medications   Medication Sig Start Date End Date Taking? Authorizing Provider  hydrocortisone 2.5 % lotion Apply topically 2 (two) times daily. Use sparingly for maximum of 14 days Patient not taking: Reported on 07/12/2016 04/06/15   Emi Belfasteborah B Gessner, FNP   No Known Allergies     Objective:  Physical Exam  Constitutional: She is oriented to person, place, and time. She appears well-developed and well-nourished.  HENT:  Head: Normocephalic and atraumatic.  Right Ear: Hearing, tympanic membrane, external ear and ear canal normal.  Left Ear: Hearing, tympanic membrane, external ear and ear canal normal.  Nose: Mucosal edema, rhinorrhea and sinus tenderness present. Right sinus exhibits frontal sinus tenderness. Left sinus exhibits frontal sinus tenderness.  Mouth/Throat: No oropharyngeal exudate, posterior oropharyngeal edema or posterior oropharyngeal erythema.  Neck: Normal range of motion. Neck supple.  Cardiovascular: Normal rate, regular rhythm, normal heart sounds and intact distal pulses.   Pulmonary/Chest: Effort normal and breath sounds normal.  Abdominal: Soft. Bowel sounds are normal. There is tenderness.  Mid epigastric tenderness with deep palpation Negative for mass.  Musculoskeletal: Normal range of motion.  Lymphadenopathy:    She has no cervical adenopathy.  Neurological: She is alert and oriented to person, place, and time.  Skin: Skin is warm and dry.  Toes surrounded by dry skin. Toenails thickened and discolored.  Psychiatric: She has a normal mood and affect. Her behavior is  normal. Judgment and thought content normal.    Vitals:   07/12/16 0803  BP: 108/60  Pulse: 78  Temp: 98 F (36.7 C)     Assessment & Plan:  1. Acute non-recurrent frontal sinusitis -Start Amoxicillin-Clavulanate (Augmentin) 875-125 mg 1 tablet, twice daily with food.  2. Nail fungus - COMPLETE METABOLIC PANEL WITH GFR-evaluate liver  and renal function. If stable, start oral Lamisil for toe nail fungus.  3. Bloating -Start Ranitidine 150 mg as needed daily.  Return for follow-up as needed. You will be contacted regarding your lab results.  Godfrey PickKimberly S. Tiburcio PeaHarris, MSN, FNP-C [facility]

## 2016-07-12 NOTE — Telephone Encounter (Signed)
Please advise patient liver and kidney function were normal. Prescription for terbinafine (lamisil) sent to pharmacy for toenail fungus.  Godfrey PickKimberly S. Tiburcio PeaHarris, MSN, FNP-C [facility]

## 2016-07-12 NOTE — Patient Instructions (Addendum)
Augmentin 875-125 mg 1 tablet twice daily with food for Sinus Infection.  Benzonatate 100-200 mg up to 3 times daily for Cough.  Zantac 150 mg as needed daily for bloating symptoms.  Once I receive lab results, I will prescribe medication for your nail fungus.    IF you received an x-ray today, you will receive an invoice from [facility]. Please contact [facility] at [phone] with questions or concerns regarding your invoice.   IF you received labwork today, you will receive an invoice from [facility]. Please contact [facility] at [phone] with questions or concerns regarding your invoice.   Our billing staff will not be able to assist you with questions regarding bills from these companies.  You will be contacted with the lab results as soon as they are available. The fastest way to get your results is to activate your My Chart account. Instructions are located on the last page of this paperwork. If you have not heard from us regarding the results in 2 weeks, please contact this office.    Sinusitis, Adult Sinusitis is soreness and inflammation of your sinuses. Sinuses are hollow spaces in the bones around your face. They are located:  Around your eyes.  In the middle of your forehead.  Behind your nose.  In your cheekbones. Your sinuses and nasal passages are lined with a stringy fluid (mucus). Mucus normally drains out of your sinuses. When your nasal tissues get inflamed or swollen, the mucus can get trapped or blocked so air cannot flow through your sinuses. This lets bacteria, viruses, and funguses grow, and that leads to infection. Follow these instructions at home: Medicines  Take, use, or apply over-the-counter and prescription medicines only as told by your doctor. These may include nasal sprays.  If you were prescribed an antibiotic medicine, take it as told by your doctor. Do not stop taking the antibiotic even if you  start to feel better. Hydrate and Humidify  Drink enough water to keep your pee (urine) clear or pale yellow.  Use a cool mist humidifier to keep the humidity level in your home above 50%.  Breathe in steam for 10-15 minutes, 3-4 times a day or as told by your doctor. You can do this in the bathroom while a hot shower is running.  Try not to spend time in cool or dry air. Rest  Rest as much as possible.  Sleep with your head raised (elevated).  Make sure to get enough sleep each night. General instructions  Put a warm, moist washcloth on your face 3-4 times a day or as told by your doctor. This will help with discomfort.  Wash your hands often with soap and water. If there is no soap and water, use hand sanitizer.  Do not smoke. Avoid being around people who are smoking (secondhand smoke).  Keep all follow-up visits as told by your doctor. This is important. Contact a doctor if:  You have a fever.  Your symptoms get worse.  Your symptoms do not get better within 10 days. Get help right away if:  You have a very bad headache.  You cannot stop throwing up (vomiting).  You have pain or swelling around your face or eyes.  You have trouble seeing.  You feel confused.  Your neck is stiff.  You have trouble breathing. This information is not intended to replace advice given to you by your health care provider. Make sure you discuss any questions you have with your  health care provider. Document Released: 01/18/2008 Document Revised: 03/27/2016 Document Reviewed: 05/27/2015 Elsevier Interactive Patient Education  2017 [employer]

## 2016-07-15 NOTE — Telephone Encounter (Signed)
LMOVM with normal lab results and prescription has been called into pharmacy

## 2016-08-19 ENCOUNTER — Ambulatory Visit (INDEPENDENT_AMBULATORY_CARE_PROVIDER_SITE_OTHER): Payer: BLUE CROSS/BLUE SHIELD | Admitting: Family Medicine

## 2016-08-19 VITALS — BP 106/68 | HR 66 | Temp 97.9°F | Resp 16 | Ht 64.5 in | Wt 128.0 lb

## 2016-08-19 DIAGNOSIS — Z131 Encounter for screening for diabetes mellitus: Secondary | ICD-10-CM

## 2016-08-19 DIAGNOSIS — Z13220 Encounter for screening for lipoid disorders: Secondary | ICD-10-CM

## 2016-08-19 DIAGNOSIS — Z1329 Encounter for screening for other suspected endocrine disorder: Secondary | ICD-10-CM | POA: Diagnosis not present

## 2016-08-19 DIAGNOSIS — Z0000 Encounter for general adult medical examination without abnormal findings: Secondary | ICD-10-CM

## 2016-08-19 DIAGNOSIS — Z130 Encounter for screening for diseases of the blood and blood-forming organs and certain disorders involving the immune mechanism: Secondary | ICD-10-CM

## 2016-08-19 DIAGNOSIS — R42 Dizziness and giddiness: Secondary | ICD-10-CM | POA: Diagnosis not present

## 2016-08-19 DIAGNOSIS — L309 Dermatitis, unspecified: Secondary | ICD-10-CM

## 2016-08-19 LAB — POC MICROSCOPIC URINALYSIS (UMFC): Mucus: ABSENT

## 2016-08-19 LAB — POCT URINALYSIS DIP (MANUAL ENTRY)
Bilirubin, UA: NEGATIVE
GLUCOSE UA: NEGATIVE
Ketones, POC UA: NEGATIVE
Leukocytes, UA: NEGATIVE
NITRITE UA: NEGATIVE
Protein Ur, POC: NEGATIVE
Spec Grav, UA: 1.015
UROBILINOGEN UA: 0.2
pH, UA: 7

## 2016-08-19 MED ORDER — SCOPOLAMINE 1 MG/3DAYS TD PT72
1.0000 | MEDICATED_PATCH | TRANSDERMAL | 12 refills | Status: DC
Start: 2016-08-19 — End: 2017-05-13

## 2016-08-19 MED ORDER — CLOBETASOL PROPIONATE 0.05 % EX OINT: 1.0000 "application " | TOPICAL_OINTMENT | Freq: Two times a day (BID) | CUTANEOUS | 1 refills | Status: DC

## 2016-08-19 NOTE — Progress Notes (Signed)
Victoria CahillKedija Y Mccollom  MRN: [id number] DOB: 01-16-1971  Subjective:  46 year old female presents for annual physical exam. [facility] and was previously seen and treated for multiple acute complaints back in November.  Vaccinations      Tetanus       PAP-patient is current on exam.       Other concerns today:  1. She continues to complain of sensation of dizziness and room spinning upon awakening in the morning.  Symptoms resolve upon getting up and walking around.  2. Ezcema on both arms.  Patient Active Problem List   Diagnosis Date Noted  . Eczema 06/18/2014  . DDD (degenerative disc disease), lumbosacral 10/03/2013  . Avitaminosis D 07/31/2013  . Irregular menses on the progesterone only minipill 09/02/2011  . ARTHRITIS, GENERALIZED 04/23/2007  . ABDOMINAL PAIN, CHRONIC 04/20/2007    Current Outpatient Prescriptions on File Prior to Visit  Medication Sig Dispense Refill  . terbinafine (LAMISIL) 250 MG tablet Take 1 tablet (250 mg total) by mouth daily. Take one tablet daily for toenail fungus for 12 weeks. 84 tablet 0   No current facility-administered medications on file prior to visit.     No Known Allergies  Social History   Social History  . Marital status: Married    Spouse name: N/A  . Number of children: N/A  . Years of education: N/A   Social History Main Topics  . Smoking status: Never Smoker  . Smokeless tobacco: Never Used  . Alcohol use No  . Drug use: No  . Sexual activity: Yes    Birth control/ protection: Pill   Other Topics Concern  . None   Social History Narrative  . None    Past Surgical History:  Procedure Laterality Date  . CESAREAN SECTION     one section     No family history on file.  Review of Systems   See HPI Objective:  BP 106/68 (BP Location: Right Arm, Patient Position: Sitting, Cuff Size: Normal)   Pulse 66   Temp 97.9 F (36.6 C) (Oral)   Resp 16   Ht 5' 4.5" (1.638 m)   Wt 128 lb  (58.1 kg)   LMP 05/02/2014 Comment: irregular cycle  SpO2 100%   BMI 21.63 kg/m   Physical Exam  Constitutional: She is oriented to person, place, and time and well-developed, well-nourished, and in no distress.  HENT:  Head: Normocephalic and atraumatic.  Right Ear: External ear normal.  Left Ear: External ear normal.  Nose: Nose normal.  Mouth/Throat: Oropharynx is clear and moist.  Eyes: Pupils are equal, round, and reactive to light.  Neck: Normal range of motion. Neck supple.  Cardiovascular: Normal rate, regular rhythm and normal heart sounds.   No murmur heard. Pulmonary/Chest: Effort normal and breath sounds normal.  Neurological: She is alert and oriented to person, place, and time. Gait normal.  Skin: Skin is warm and dry.  Psychiatric: Mood, memory, affect and judgment normal.     Visual Acuity Screening   Right eye Left eye Both eyes  Without correction:     With correction: 20/20 20/20 20/20     Assessment and Plan :  1. Annual physical exam - POCT Microscopic Urinalysis ([facility]) - POCT urinalysis dipstick Age-appropriate anticipatory guidance provided.  2. Screening for diabetes mellitus - Comprehensive metabolic panel  3. Screening, lipid - Lipid panel  4. Screening for thyroid disorder - TSH  5. Screening, anemia, deficiency, iron - CBC  with Differential/Platelet  6. Eczema -Clobetasol  Ointment (Temovate) 0.05%, 2 times daily to affected are  7. Vertigo Plan: Scopolamine patches, 1 patch every 72 hours    You will be notified of your lab results.   Godfrey Pick. Tiburcio Pea, MSN, FNP-C Primary Care at [facility] [phone]

## 2016-08-19 NOTE — Patient Instructions (Addendum)
Apply scopolamine patch behind ear once every 3 days for vertigo.  Apply clobetasol cream to eczema on bilateral arms twice daily.  You will be notified of your lab results.  IF you received an x-ray today, you will receive an invoice from [facility]. Please contact [facility] at [phone] with questions or concerns regarding your invoice.   IF you received labwork today, you will receive an invoice from [facility]. Please contact [facility] at [phone] with questions or concerns regarding your invoice.   Our billing staff will not be able to assist you with questions regarding bills from these companies.  You will be contacted with the lab results as soon as they are available. The fastest way to get your results is to activate your My Chart account. Instructions are located on the last page of this paperwork. If you have not heard from us regarding the results in 2 weeks, please contact this office.      Benign Positional Vertigo Introduction Vertigo is the feeling that you or your surroundings are moving when they are not. Benign positional vertigo is the most common form of vertigo. The cause of this condition is not serious (is benign). This condition is triggered by certain movements and positions (is positional). This condition can be dangerous if it occurs while you are doing something that could endanger you or others, such as driving. What are the causes? In many cases, the cause of this condition is not known. It may be caused by a disturbance in an area of the inner ear that helps your brain to sense movement and balance. This disturbance can be caused by a viral infection (labyrinthitis), head injury, or repetitive motion. What increases the risk? This condition is more likely to develop in:  Women.  People who are 47 years of age or older. What are the signs or symptoms? Symptoms of this condition usually happen when you move your head or your eyes in  different directions. Symptoms may start suddenly, and they usually last for less than a minute. Symptoms may include:  Loss of balance and falling.  Feeling like you are spinning or moving.  Feeling like your surroundings are spinning or moving.  Nausea and vomiting.  Blurred vision.  Dizziness.  Involuntary eye movement (nystagmus). Symptoms can be mild and cause only slight annoyance, or they can be severe and interfere with daily life. Episodes of benign positional vertigo may return (recur) over time, and they may be triggered by certain movements. Symptoms may improve over time. How is this diagnosed? This condition is usually diagnosed by medical history and a physical exam of the head, neck, and ears. You may be referred to a health care provider who specializes in ear, nose, and throat (ENT) problems (otolaryngologist) or a provider who specializes in disorders of the nervous system (neurologist). You may have additional testing, including:  MRI.  A CT scan.  Eye movement tests. Your health care provider may ask you to change positions quickly while he or she watches you for symptoms of benign positional vertigo, such as nystagmus. Eye movement may be tested with an electronystagmogram (ENG), caloric stimulation, the Dix-Hallpike test, or the roll test.  An electroencephalogram (EEG). This records electrical activity in your brain.  Hearing tests. How is this treated? Usually, your health care provider will treat this by moving your head in specific positions to adjust your inner ear back to normal. Surgery may be needed in severe cases, but this is rare. In some  cases, benign positional vertigo may resolve on its own in 2-4 weeks. Follow these instructions at home: Safety  Move slowly.Avoid sudden body or head movements.  Avoid driving.  Avoid operating heavy machinery.  Avoid doing any tasks that would be dangerous to you or others if a vertigo episode would  occur.  If you have trouble walking or keeping your balance, try using a cane for stability. If you feel dizzy or unstable, sit down right away.  Return to your normal activities as told by your health care provider. Ask your health care provider what activities are safe for you. General instructions  Take over-the-counter and prescription medicines only as told by your health care provider.  Avoid certain positions or movements as told by your health care provider.  Drink enough fluid to keep your urine clear or pale yellow.  Keep all follow-up visits as told by your health care provider. This is important. Contact a health care provider if:  You have a fever.  Your condition gets worse or you develop new symptoms.  Your family or friends notice any behavioral changes.  Your nausea or vomiting gets worse.  You have numbness or a "pins and needles" sensation. Get help right away if:  You have difficulty speaking or moving.  You are always dizzy.  You faint.  You develop severe headaches.  You have weakness in your legs or arms.  You have changes in your hearing or vision.  You develop a stiff neck.  You develop sensitivity to light. This information is not intended to replace advice given to you by your health care provider. Make sure you discuss any questions you have with your health care provider. Document Released: 05/09/2006 Document Revised: 01/07/2016 Document Reviewed: 11/24/2014  2017 Elsevier

## 2016-08-20 LAB — CBC WITH DIFFERENTIAL/PLATELET
BASOS: 1 %
Basophils Absolute: 0 10*3/uL (ref 0.0–0.2)
EOS (ABSOLUTE): 0.1 10*3/uL (ref 0.0–0.4)
Eos: 2 %
HEMATOCRIT: 35.9 % (ref 34.0–46.6)
Hemoglobin: 11.8 g/dL (ref 11.1–15.9)
IMMATURE GRANS (ABS): 0 10*3/uL (ref 0.0–0.1)
IMMATURE GRANULOCYTES: 0 %
LYMPHS: 57 %
Lymphocytes Absolute: 2.3 10*3/uL (ref 0.7–3.1)
MCH: 27.6 pg (ref 26.6–33.0)
MCHC: 32.9 g/dL (ref 31.5–35.7)
MCV: 84 fL (ref 79–97)
MONOCYTES: 7 %
Monocytes Absolute: 0.3 10*3/uL (ref 0.1–0.9)
NEUTROS PCT: 33 %
Neutrophils Absolute: 1.4 10*3/uL (ref 1.4–7.0)
Platelets: 250 10*3/uL (ref 150–379)
RBC: 4.28 x10E6/uL (ref 3.77–5.28)
RDW: 14 % (ref 12.3–15.4)
WBC: 4.1 10*3/uL (ref 3.4–10.8)

## 2016-08-20 LAB — LIPID PANEL
CHOL/HDL RATIO: 2.5 ratio (ref 0.0–4.4)
Cholesterol, Total: 160 mg/dL (ref 100–199)
HDL: 65 mg/dL (ref >39)
LDL Calculated: 84 mg/dL (ref 0–99)
Triglycerides: 53 mg/dL (ref 0–149)
VLDL CHOLESTEROL CAL: 11 mg/dL (ref 5–40)

## 2016-08-20 LAB — COMPREHENSIVE METABOLIC PANEL
ALT: 28 IU/L (ref 0–32)
AST: 25 IU/L (ref 0–40)
Albumin/Globulin Ratio: 1.5 (ref 1.2–2.2)
Albumin: 4.5 g/dL (ref 3.5–5.5)
Alkaline Phosphatase: 61 IU/L (ref 39–117)
BUN/Creatinine Ratio: 22 (ref 9–23)
BUN: 11 mg/dL (ref 6–24)
Bilirubin Total: 0.2 mg/dL (ref 0.0–1.2)
CALCIUM: 9.7 mg/dL (ref 8.7–10.2)
CO2: 24 mmol/L (ref 18–29)
CREATININE: 0.49 mg/dL — AB (ref 0.57–1.00)
Chloride: 100 mmol/L (ref 96–106)
GFR calc Af Amer: 135 mL/min/{1.73_m2} (ref >59)
GFR, EST NON AFRICAN AMERICAN: 117 mL/min/{1.73_m2} (ref >59)
GLOBULIN, TOTAL: 3.1 g/dL (ref 1.5–4.5)
GLUCOSE: 90 mg/dL (ref 65–99)
Potassium: 4 mmol/L (ref 3.5–5.2)
Sodium: 142 mmol/L (ref 134–144)
Total Protein: 7.6 g/dL (ref 6.0–8.5)

## 2016-08-20 LAB — TSH: TSH: 1.91 u[IU]/mL (ref 0.450–4.500)

## 2016-08-21 ENCOUNTER — Encounter: Payer: Self-pay | Admitting: Family Medicine

## 2016-08-21 NOTE — Progress Notes (Signed)
All normal results

## 2017-05-13 ENCOUNTER — Encounter: Payer: Self-pay | Admitting: Family Medicine

## 2017-05-13 ENCOUNTER — Ambulatory Visit (INDEPENDENT_AMBULATORY_CARE_PROVIDER_SITE_OTHER): Payer: BLUE CROSS/BLUE SHIELD | Admitting: Family Medicine

## 2017-05-13 VITALS — BP 98/60 | HR 63 | Temp 97.4°F | Resp 18 | Ht 65.0 in | Wt 129.4 lb

## 2017-05-13 DIAGNOSIS — R5383 Other fatigue: Secondary | ICD-10-CM

## 2017-05-13 DIAGNOSIS — R079 Chest pain, unspecified: Secondary | ICD-10-CM

## 2017-05-13 LAB — POCT CBC
Granulocyte percent: 45.1 %G (ref 37–80)
HCT, POC: 37.2 % — AB (ref 37.7–47.9)
Hemoglobin: 12.5 g/dL (ref 12.2–16.2)
Lymph, poc: 1.4 (ref 0.6–3.4)
MCH, POC: 29.5 pg (ref 27–31.2)
MCHC: 33.6 g/dL (ref 31.8–35.4)
MCV: 87.9 fL (ref 80–97)
MID (cbc): 0.3 (ref 0–0.9)
MPV: 6.5 fL (ref 0–99.8)
POC Granulocyte: 1.4 — AB (ref 2–6.9)
POC LYMPH PERCENT: 45.8 %L (ref 10–50)
POC MID %: 9.1 %M (ref 0–12)
Platelet Count, POC: 275 10*3/uL (ref 142–424)
RBC: 4.23 M/uL (ref 4.04–5.48)
RDW, POC: 13 %
WBC: 3 10*3/uL — AB (ref 4.6–10.2)

## 2017-05-13 NOTE — Progress Notes (Signed)
9/29/201812:20 PM  [name] [birth year], 46 y.o. female [id number]  Chief Complaint  Patient presents with  . Fatigue  . Rash    All over body  . Chest Pain    Mid sternum, Pt states "It feels tired & burning" x 3 days. Not in pain today    HPI:   Patient is a 46 y.o. female with no significant past medical history who presents today for chest pain that happend 3 days ago. Patient reports week earlier she was sick with a bad cold, got better. Then one days when she came back from work she had sudden onset of chest pain, pressure, burning, middle of the chest, felt very tired. She went to lie down, that night had pain for most of the night but had resolved by early morning so she went to work. At work, which is quit physical, she was fine, and then again once she got home, pain started again, this time it did not last into the night. She denies any radiation of pain. She does endorse SOB and diaphoresis with it. She does not associate it with food. She states that it has since resolved and she has not had any issues since yesterday.  Depression screen [facility] 2/9 05/13/2017 08/19/2016 07/12/2016  Decreased Interest 0 0 0  Down, Depressed, Hopeless 0 0 0  PHQ - 2 Score 0 0 0    No Known Allergies  No current outpatient prescriptions on file prior to visit.   No current facility-administered medications on file prior to visit.     Past Medical History:  Diagnosis Date  . Eczema   . Neuromuscular disorder [facility])     Past Surgical History:  Procedure Laterality Date  . CESAREAN SECTION     one section     Social History  Substance Use Topics  . Smoking status: Never Smoker  . Smokeless tobacco: Never Used  . Alcohol use No    History reviewed. No pertinent family history.  Review of Systems  Constitutional: Positive for diaphoresis and malaise/fatigue. Negative for chills and fever.  HENT: Negative for congestion, ear pain and sore throat.   Eyes: Negative for  blurred vision and double vision.  Respiratory: Positive for shortness of breath. Negative for cough.   Cardiovascular: Positive for chest pain. Negative for palpitations, orthopnea, leg swelling and PND.  Gastrointestinal: Positive for heartburn. Negative for abdominal pain, blood in stool, constipation, diarrhea, melena, nausea and vomiting.  Genitourinary: Negative for dysuria and hematuria.  Musculoskeletal: Negative for myalgias.  Skin: Positive for rash (known eczema, not new).  Neurological: Negative for dizziness and headaches.     OBJECTIVE:  Blood pressure 98/60, pulse 63, temperature (!) 97.4 F (36.3 C), temperature source Oral, resp. rate 18, height  (1.651 m), weight 129 lb 6.4 oz (58.7 kg), last menstrual period 05/02/2014, SpO2 100 %.  Physical Exam  Constitutional: She is oriented to person, place, and time and well-developed, well-nourished, and in no distress.  HENT:  Head: Normocephalic and atraumatic.  Right Ear: Hearing, tympanic membrane, external ear and ear canal normal.  Left Ear: Hearing, tympanic membrane, external ear and ear canal normal.  Mouth/Throat: Oropharynx is clear and moist.  Eyes: Pupils are equal, round, and reactive to light. EOM are normal.  Neck: Neck supple. No thyromegaly present.  Cardiovascular: Normal rate, regular rhythm, normal heart sounds and intact distal pulses.  Exam reveals no gallop and no friction rub.   No murmur heard. Pulmonary/Chest: Effort  normal and breath sounds normal. She has no wheezes. She has no rales. She exhibits tenderness.  Abdominal: Soft. Bowel sounds are normal. She exhibits no distension and no mass. There is tenderness (diffuse mild TTP). There is no rebound and no guarding.  Musculoskeletal: Normal range of motion. She exhibits no edema.  Lymphadenopathy:    She has no cervical adenopathy.  Neurological: She is alert and oriented to person, place, and time. She has normal reflexes. Gait normal.    Skin: Skin is warm and dry. Rash (scattered areas of eczema) noted. She is not diaphoretic. There is pallor.  Psychiatric: Mood and affect normal.    Results for orders placed or performed in visit on 05/13/17 (from the past 24 hour(s))  POCT CBC     Status: Abnormal   Collection Time: 05/13/17  9:31 AM  Result Value Ref Range   WBC 3.0 (A) 4.6 - 10.2 K/uL   Lymph, poc 1.4 0.6 - 3.4   POC LYMPH PERCENT 45.8 10 - 50 %L   MID (cbc) 0.3 0 - 0.9   POC MID % 9.1 0 - 12 %M   POC Granulocyte 1.4 (A) 2 - 6.9   Granulocyte percent 45.1 37 - 80 %G   RBC 4.23 4.04 - 5.48 M/uL   Hemoglobin 12.5 12.2 - 16.2 g/dL   HCT, POC 16.1 (A) 09.6 - 47.9 %   MCV 87.9 80 - 97 fL   MCH, POC 29.5 27 - 31.2 pg   MCHC 33.6 31.8 - 35.4 g/dL   RDW, POC 04.5 %   Platelet Count, POC 275 142 - 424 K/uL   MPV 6.5 0 - 99.8 fL    No results found.   ASSESSMENT and PLAN  1. Chest pain, unspecified type Unclear etiology, now resolved, EKG normal.  Discussed ER precautions. - EKG 12-Lead - NSR, HR 61, no St changes, no q waves.   2. Fatigue, unspecified type CBC with mildly low WBC, maybe viral illness, all symptoms now resolved.  - POCT CBC - TSH - Comprehensive metabolic panel   Return if symptoms worsen or fail to improve.    Myles Lipps, MD Primary Care at [facility] [address] [place], [place] [address] Ph.  [phone] Fax [phone]

## 2017-05-13 NOTE — Patient Instructions (Signed)
     IF you received an x-ray today, you will receive an invoice from [facility]. Please contact [facility] at [phone] with questions or concerns regarding your invoice.   IF you received labwork today, you will receive an invoice from [facility]. Please contact [facility] at [phone] with questions or concerns regarding your invoice.   Our billing staff will not be able to assist you with questions regarding bills from these companies.  You will be contacted with the lab results as soon as they are available. The fastest way to get your results is to activate your My Chart account. Instructions are located on the last page of this paperwork. If you have not heard from us regarding the results in 2 weeks, please contact this office.     

## 2017-05-14 LAB — COMPREHENSIVE METABOLIC PANEL
ALT: 14 IU/L (ref 0–32)
AST: 18 IU/L (ref 0–40)
Albumin/Globulin Ratio: 1.6 (ref 1.2–2.2)
Albumin: 4.5 g/dL (ref 3.5–5.5)
Alkaline Phosphatase: 56 IU/L (ref 39–117)
BUN/Creatinine Ratio: 34 — ABNORMAL HIGH (ref 9–23)
BUN: 17 mg/dL (ref 6–24)
Bilirubin Total: 0.4 mg/dL (ref 0.0–1.2)
CO2: 28 mmol/L (ref 20–29)
Calcium: 9.8 mg/dL (ref 8.7–10.2)
Chloride: 102 mmol/L (ref 96–106)
Creatinine, Ser: 0.5 mg/dL — ABNORMAL LOW (ref 0.57–1.00)
GFR calc Af Amer: 133 mL/min/{1.73_m2} (ref >59)
GFR calc non Af Amer: 116 mL/min/{1.73_m2} (ref >59)
Globulin, Total: 2.9 g/dL (ref 1.5–4.5)
Glucose: 92 mg/dL (ref 65–99)
Potassium: 4.1 mmol/L (ref 3.5–5.2)
Sodium: 143 mmol/L (ref 134–144)
Total Protein: 7.4 g/dL (ref 6.0–8.5)

## 2017-05-14 LAB — TSH: TSH: 0.71 u[IU]/mL (ref 0.450–4.500)

## 2017-10-02 ENCOUNTER — Encounter: Payer: Self-pay | Admitting: Physician Assistant

## 2017-10-02 ENCOUNTER — Other Ambulatory Visit: Payer: Self-pay

## 2017-10-02 ENCOUNTER — Ambulatory Visit (INDEPENDENT_AMBULATORY_CARE_PROVIDER_SITE_OTHER): Payer: BLUE CROSS/BLUE SHIELD | Admitting: Physician Assistant

## 2017-10-02 VITALS — BP 108/72 | HR 75 | Temp 98.6°F | Resp 16 | Ht 65.0 in | Wt 130.8 lb

## 2017-10-02 DIAGNOSIS — J069 Acute upper respiratory infection, unspecified: Secondary | ICD-10-CM | POA: Diagnosis not present

## 2017-10-02 DIAGNOSIS — J3489 Other specified disorders of nose and nasal sinuses: Secondary | ICD-10-CM

## 2017-10-02 DIAGNOSIS — R52 Pain, unspecified: Secondary | ICD-10-CM

## 2017-10-02 DIAGNOSIS — R05 Cough: Secondary | ICD-10-CM

## 2017-10-02 DIAGNOSIS — R059 Cough, unspecified: Secondary | ICD-10-CM

## 2017-10-02 MED ORDER — GUAIFENESIN ER 1200 MG PO TB12: 1.0000 | ORAL_TABLET | Freq: Two times a day (BID) | ORAL | 1 refills | Status: DC | PRN

## 2017-10-02 MED ORDER — BENZONATATE 100 MG PO CAPS: 100.0000 mg | ORAL_CAPSULE | Freq: Three times a day (TID) | ORAL | 0 refills | Status: DC | PRN

## 2017-10-02 MED ORDER — HYDROCODONE-HOMATROPINE 5-1.5 MG/5ML PO SYRP: 5.0000 mL | ORAL_SOLUTION | Freq: Three times a day (TID) | ORAL | 0 refills | Status: DC | PRN

## 2017-10-02 MED ORDER — IPRATROPIUM BROMIDE 0.03 % NA SOLN: 2.0000 | Freq: Two times a day (BID) | NASAL | 0 refills | Status: DC

## 2017-10-02 NOTE — Patient Instructions (Addendum)
- We will treat this as a respiratory viral infection.  - I recommend you rest, drink plenty of fluids, eat light meals including soups.  - You may use cough syrup at night for your cough and sore throat, Tessalon pearls during the day if you want to stop the cough and mucinex if you want to help bring stuff up. Be aware that cough syrup can definitely make you drowsy and sleepy so do not drive or operate any heavy machinery if it is affecting you during the day.  - You may also use atrovent for your runny nose.  - Please let me know if you are not seeing any improvement or get worse in 5-7 days.   Upper Respiratory Infection, Adult Most upper respiratory infections (URIs) are caused by a virus. A URI affects the nose, throat, and upper air passages. The most common type of URI is often called "the common cold." Follow these instructions at home:  Take medicines only as told by your doctor.  Gargle warm saltwater or take cough drops to comfort your throat as told by your doctor.  Use a warm mist humidifier or inhale steam from a shower to increase air moisture. This may make it easier to breathe.  Drink enough fluid to keep your pee (urine) clear or pale yellow.  Eat soups and other clear broths.  Have a healthy diet.  Rest as needed.  Go back to work when your fever is gone or your doctor says it is okay. ? You may need to stay home longer to avoid giving your URI to others. ? You can also wear a face mask and wash your hands often to prevent spread of the virus.  Use your inhaler more if you have asthma.  Do not use any tobacco products, including cigarettes, chewing tobacco, or electronic cigarettes. If you need help quitting, ask your doctor. Contact a doctor if:  You are getting worse, not better.  Your symptoms are not helped by medicine.  You have chills.  You are getting more short of breath.  You have brown or red mucus.  You have yellow or brown discharge from  your nose.  You have pain in your face, especially when you bend forward.  You have a fever.  You have puffy (swollen) neck glands.  You have pain while swallowing.  You have white areas in the back of your throat. Get help right away if:  You have very bad or constant: ? Headache. ? Ear pain. ? Pain in your forehead, behind your eyes, and over your cheekbones (sinus pain). ? Chest pain.  You have long-lasting (chronic) lung disease and any of the following: ? Wheezing. ? Long-lasting cough. ? Coughing up blood. ? A change in your usual mucus.  You have a stiff neck.  You have changes in your: ? Vision. ? Hearing. ? Thinking. ? Mood. This information is not intended to replace advice given to you by your health care provider. Make sure you discuss any questions you have with your health care provider. Document Released: 01/18/2008 Document Revised: 04/03/2016 Document Reviewed: 11/06/2013 Elsevier Interactive Patient Education  2018 [employer]    IF you received an x-ray today, you will receive an invoice from [facility]. Please contact [facility] at [phone] with questions or concerns regarding your invoice.   IF you received labwork today, you will receive an invoice from [facility]. Please contact [facility] at [phone] with questions or concerns regarding your invoice.  Our billing staff will not be able to assist you with questions regarding bills from these companies.  You will be contacted with the lab results as soon as they are available. The fastest way to get your results is to activate your My Chart account. Instructions are located on the last page of this paperwork. If you have not heard from us regarding the results in 2 weeks, please contact this office.     

## 2017-10-02 NOTE — Progress Notes (Signed)
MRN: [id number] DOB: 10-03-1970  Subjective:   [name] is a 47 y.o. female presenting for chief complaint of Cough ("dry", x 5 days); Headache ("getting better"); feeling weakness; and shoulders and chest feels burning .  Reports 5 day history of body generalized body aches, fatigue, and dry cough. She is feeling better but canot sleep due to the cough. Has tried cold medication without relief. Denies fever, sinus pain, sore throat, wheezing, shortness of breath and chest pain, night sweats, nausea, vomiting, abdominal pain and diarrhea. Has had sick contact with son.Has history of seasonal allergies, no history of asthma. Patient has not had flu shot this season. Denies smoking.  Denies any other aggravating or relieving factors, no other questions or concerns.  [name] currently has no medications in their medication list. Also has No Known Allergies.  [name]  has a past medical history of Eczema and Neuromuscular disorder (HCC). Also  has a past surgical history that includes Cesarean section.   Objective:   Vitals: BP 108/72   Pulse 75   Temp 98.6 F (37 C) (Oral)   Resp 16   Ht 5\' 5"  (1.651 m)   Wt 130 lb 12.8 oz (59.3 kg)   LMP 05/02/2014 Comment: irregular cycle  SpO2 100%   BMI 21.77 kg/m   Physical Exam  Constitutional: She is oriented to person, place, and time. She appears well-developed and well-nourished. No distress.  HENT:  Head: Normocephalic and atraumatic.  Right Ear: Tympanic membrane, external ear and ear canal normal.  Left Ear: Tympanic membrane, external ear and ear canal normal.  Nose: Rhinorrhea present. No mucosal edema. Right sinus exhibits no maxillary sinus tenderness and no frontal sinus tenderness. Left sinus exhibits no maxillary sinus tenderness and no frontal sinus tenderness.  Mouth/Throat: Uvula is midline, oropharynx is clear and moist and mucous membranes are normal. No tonsillar exudate.  Eyes: Conjunctivae are normal.  Neck:  Normal range of motion.  Pulmonary/Chest: Effort normal. She has no decreased breath sounds. She has no wheezes. She has no rhonchi. She has no rales.  Lymphadenopathy:       Head (right side): No submental, no submandibular, no tonsillar, no preauricular, no posterior auricular and no occipital adenopathy present.       Head (left side): No submental, no submandibular, no tonsillar, no preauricular, no posterior auricular and no occipital adenopathy present.    She has no cervical adenopathy.       Right: No supraclavicular adenopathy present.       Left: No supraclavicular adenopathy present.  Neurological: She is alert and oriented to person, place, and time.  Skin: Skin is warm and dry.  Psychiatric: She has a normal mood and affect.  Vitals reviewed.   No results found for this or any previous visit (from the past 24 hour(s)).  Assessment and Plan :  1. Cough - benzonatate (TESSALON) 100 MG capsule; Take 1-2 capsules (100-200 mg total) by mouth 3 (three) times daily as needed for cough.  Dispense: 40 capsule; Refill: 0 - HYDROcodone-homatropine (HYCODAN) 5-1.5 MG/5ML syrup; Take 5 mLs by mouth every 8 (eight) hours as needed for cough.  Dispense: 120 mL; Refill: 0 - Guaifenesin (MUCINEX MAXIMUM STRENGTH) 1200 MG TB12; Take 1 tablet (1,200 mg total) by mouth every 12 (twelve) hours as needed.  Dispense: 14 tablet; Refill: 1  2. Body aches 3. Rhinorrhea - ipratropium (ATROVENT) 0.03 % nasal spray; Place 2 sprays into both nostrils 2 (two) times daily.  Dispense: 30 mL; Refill: 0  4. Acute upper respiratory infection Hx and PE findings consistent with viral etiology. Vitals stable. She is overall well appearing, no distress. Lungs CTAB. Recommend sx tx at this time. Advised to feturn to clinic if symptoms worsen, do not improve in 5-7 days, or as needed.    Benjiman CoreBrittany Manasi Dishon, PA-C  Primary Care at [facility] 10/02/2017 12:41 PM

## 2018-01-06 ENCOUNTER — Encounter: Payer: Self-pay | Admitting: Physician Assistant

## 2018-01-06 ENCOUNTER — Ambulatory Visit (INDEPENDENT_AMBULATORY_CARE_PROVIDER_SITE_OTHER): Payer: BLUE CROSS/BLUE SHIELD | Admitting: Physician Assistant

## 2018-01-06 VITALS — BP 110/66 | HR 70 | Temp 98.3°F | Resp 16 | Ht 64.5 in | Wt 130.0 lb

## 2018-01-06 DIAGNOSIS — R634 Abnormal weight loss: Secondary | ICD-10-CM

## 2018-01-06 DIAGNOSIS — R21 Rash and other nonspecific skin eruption: Secondary | ICD-10-CM | POA: Diagnosis not present

## 2018-01-06 LAB — POCT URINALYSIS DIP (MANUAL ENTRY)
Bilirubin, UA: NEGATIVE
Blood, UA: NEGATIVE
Glucose, UA: NEGATIVE mg/dL
Ketones, POC UA: NEGATIVE mg/dL
Leukocytes, UA: NEGATIVE
Nitrite, UA: NEGATIVE
Protein Ur, POC: NEGATIVE mg/dL
Spec Grav, UA: 1.02 (ref 1.010–1.025)
Urobilinogen, UA: 0.2 E.U./dL
pH, UA: 7.5 (ref 5.0–8.0)

## 2018-01-06 MED ORDER — TRIAMCINOLONE ACETONIDE 0.1 % EX CREA: 1.0000 "application " | TOPICAL_CREAM | Freq: Two times a day (BID) | CUTANEOUS | 0 refills | Status: DC

## 2018-01-06 NOTE — Patient Instructions (Addendum)
We will contact you with your lab results when they come back.   We recommend that you schedule a mammogram for breast cancer screening. Typically, you do not need a referral to do this. Please contact a local imaging center to schedule your mammogram.  [facility]) - [phone] or [phone]  [facility] - [phone]  Come back and see me in August for your annual exam and PAP.   For your rash:  - Apply Kenalog cream to your arms twice daily for 2 weeks.   - Warm soaking baths or showers using mild or soap-free cleansers should be part of the routine skin care. Emollients should be applied at least two times per day and immediately after bathing or hand-washing. (Lotions, which have a high water and low oil content, can worsen xerosis via evaporation and trigger a flare)     Pap Test Why am I having this test? A pap test is sometimes called a pap smear. It is a screening test that is used to check for signs of cancer of the vagina, cervix, and uterus. The test can also identify the presence of infection or precancerous changes. Your health care provider will likely recommend you have this test done on a regular basis. This test may be done:  Every 3 years, starting at age 68.  Every 5 years, in combination with testing for the presence of human papillomavirus (HPV).  More or less often depending on other medical conditions.  What kind of sample is taken? Using a small cotton swab, plastic spatula, or brush, your health care provider will collect a sample of cells from the surface of your cervix. Your cervix is the opening to your uterus, also called a womb. Secretions from the cervix and vagina may also be collected. How do I prepare for this test?  Be aware of where you are in your menstrual cycle. You may be asked to reschedule the test if you are menstruating on the day of the test.  You may need to reschedule if you have a known  vaginal infection on the day of the test.  You may be asked to avoid douching or taking a bath the day before or the day of the test.  Some medicines can cause abnormal test results, such as digitalis and tetracycline. Talk with your health care provider before your test if you take one of these medicines. What do the results mean? Abnormal test results may indicate a number of health conditions. These may include:  Cancer. Although pap test results cannot be used to diagnose cancer of the cervix, vagina, or uterus, they may suggest the possibility of cancer. Further tests would be required to determine if cancer is present.  Sexually transmitted disease.  Fungal infection.  Parasite infection.  Herpes infection.  A condition causing or contributing to infertility.  It is your responsibility to obtain your test results. Ask the lab or department performing the test when and how you will get your results. Contact your health care provider to discuss any questions you have about your results. Talk with your health care provider to discuss your results, treatment options, and if necessary, the need for more tests. Talk with your health care provider if you have any questions about your results. This information is not intended to replace advice given to you by your health care provider. Make sure you discuss any questions you have with your health care provider. Document Released: 10/22/2002  Document Revised: 04/06/2016 Document Reviewed: 12/23/2013 Elsevier Interactive Patient Education  [employer].   IF you received an x-ray today, you will receive an invoice from [facility]. Please contact [facility] at [phone] with questions or concerns regarding your invoice.   IF you received labwork today, you will receive an invoice from [facility]. Please contact [facility] at [phone] with questions or concerns regarding your invoice.   Our billing staff will not  be able to assist you with questions regarding bills from these companies.  You will be contacted with the lab results as soon as they are available. The fastest way to get your results is to activate your My Chart account. Instructions are located on the last page of this paperwork. If you have not heard from [place] regarding the results in 2 weeks, please contact this office.

## 2018-01-06 NOTE — Progress Notes (Signed)
   [name]  MRN: [id number] DOB: 1971/07/06  PCP: Rutherford Guys, MD  Subjective:  Pt is a 47 year old female who presents to clinic for weight loss x 2-3 years.  She used to weigh in the 150s, over the past 2 years she has weighed around 130.  Decreased appetite x 4-5 years.  Denies depression symptoms Rash - b/l anterior elbows .  Last PAP - 03/2015.   Review of Systems  Constitutional: Positive for appetite change and unexpected weight change.  Respiratory: Negative for cough and wheezing.   Cardiovascular: Negative for chest pain, palpitations and leg swelling.  Musculoskeletal: Negative for neck pain and neck stiffness.  Skin: Positive for rash.    family history is not on file.  Patient Active Problem List   Diagnosis Date Noted  . Eczema 06/18/2014  . DDD (degenerative disc disease), lumbosacral 10/03/2013  . Avitaminosis D 07/31/2013  . Irregular menses on the progesterone only minipill 09/02/2011  . ARTHRITIS, GENERALIZED 04/23/2007  . ABDOMINAL PAIN, CHRONIC 04/20/2007    Current Outpatient Medications on File Prior to Visit  Medication Sig Dispense Refill  . ipratropium (ATROVENT) 0.03 % nasal spray Place 2 sprays into both nostrils 2 (two) times daily. (Patient not taking: Reported on 01/06/2018) 30 mL 0   No current facility-administered medications on file prior to visit.     No Known Allergies   Objective:  BP 110/66 (BP Location: Left Arm, Patient Position: Sitting, Cuff Size: Normal)   Pulse 70   Temp 98.3 F (36.8 C) (Oral)   Resp 16   Ht 5' 4.5" (1.638 m)   Wt 130 lb (59 kg)   LMP 05/02/2014 Comment: irregular cycle  SpO2 100%   BMI 21.97 kg/m   Physical Exam  Constitutional: She is oriented to person, place, and time. No distress.  Cardiovascular: Normal rate, regular rhythm and normal heart sounds.  Neurological: She is alert and oriented to person, place, and time.  Skin: Skin is warm and dry.  Psychiatric: Judgment normal.    Vitals reviewed.   Wt Readings from Last 3 Encounters:  01/06/18 130 lb (59 kg)  10/02/17 130 lb 12.8 oz (59.3 kg)  05/13/17 129 lb 6.4 oz (58.7 kg)    Assessment and Plan :  1. Loss of weight 2. Rash and nonspecific skin eruption -Patient presents complaining of weight loss of about 20 pounds.  It appears her weight has been stable over the past 2 years at 130.  BMI is 21.  She endorses decreased appetite.  Also complaining of rash.  Will check labs.  Return to clinic in 3 months for annual exam and Pap.  - ANA - CMP14+EGFR - CBC with Differential/Platelet - TSH - Sedimentation Rate - ANA - Hemoglobin A1c - HIV antibody -   Mercer Pod, PA-C  Primary Care at [facility] 01/06/2018 11:15 AM

## 2018-01-09 LAB — CBC WITH DIFFERENTIAL/PLATELET
Basophils Absolute: 0 10*3/uL (ref 0.0–0.2)
Basos: 0 %
EOS (ABSOLUTE): 0.1 10*3/uL (ref 0.0–0.4)
Eos: 2 %
Hematocrit: 36.9 % (ref 34.0–46.6)
Hemoglobin: 12.3 g/dL (ref 11.1–15.9)
Immature Grans (Abs): 0 10*3/uL (ref 0.0–0.1)
Immature Granulocytes: 0 %
Lymphocytes Absolute: 1.6 10*3/uL (ref 0.7–3.1)
Lymphs: 48 %
MCH: 29.2 pg (ref 26.6–33.0)
MCHC: 33.3 g/dL (ref 31.5–35.7)
MCV: 88 fL (ref 79–97)
Monocytes Absolute: 0.2 10*3/uL (ref 0.1–0.9)
Monocytes: 7 %
Neutrophils Absolute: 1.4 10*3/uL (ref 1.4–7.0)
Neutrophils: 43 %
Platelets: 258 10*3/uL (ref 150–450)
RBC: 4.21 x10E6/uL (ref 3.77–5.28)
RDW: 13.9 % (ref 12.3–15.4)
WBC: 3.4 10*3/uL (ref 3.4–10.8)

## 2018-01-09 LAB — HIV ANTIBODY (ROUTINE TESTING W REFLEX): HIV Screen 4th Generation wRfx: NONREACTIVE

## 2018-01-09 LAB — TSH: TSH: 0.678 u[IU]/mL (ref 0.450–4.500)

## 2018-01-09 LAB — CMP14+EGFR
ALT: 11 IU/L (ref 0–32)
AST: 16 IU/L (ref 0–40)
Albumin/Globulin Ratio: 1.6 (ref 1.2–2.2)
Albumin: 4.5 g/dL (ref 3.5–5.5)
Alkaline Phosphatase: 60 IU/L (ref 39–117)
BUN/Creatinine Ratio: 23 (ref 9–23)
BUN: 15 mg/dL (ref 6–24)
Bilirubin Total: 0.2 mg/dL (ref 0.0–1.2)
CO2: 22 mmol/L (ref 20–29)
Calcium: 9.7 mg/dL (ref 8.7–10.2)
Chloride: 106 mmol/L (ref 96–106)
Creatinine, Ser: 0.64 mg/dL (ref 0.57–1.00)
GFR calc Af Amer: 122 mL/min/{1.73_m2} (ref >59)
GFR calc non Af Amer: 106 mL/min/{1.73_m2} (ref >59)
Globulin, Total: 2.8 g/dL (ref 1.5–4.5)
Glucose: 107 mg/dL — ABNORMAL HIGH (ref 65–99)
Potassium: 4.6 mmol/L (ref 3.5–5.2)
Sodium: 143 mmol/L (ref 134–144)
Total Protein: 7.3 g/dL (ref 6.0–8.5)

## 2018-01-09 LAB — ANA: Anti Nuclear Antibody(ANA): NEGATIVE

## 2018-01-09 LAB — HEMOGLOBIN A1C
Est. average glucose Bld gHb Est-mCnc: 111 mg/dL
Hgb A1c MFr Bld: 5.5 % (ref 4.8–5.6)

## 2018-01-09 LAB — SEDIMENTATION RATE: Sed Rate: 8 mm/hr (ref 0–32)

## 2018-01-18 ENCOUNTER — Telehealth: Payer: Self-pay

## 2018-01-18 NOTE — Telephone Encounter (Signed)
Provider, please review and release results.  

## 2018-01-18 NOTE — Telephone Encounter (Signed)
Copied from CRM [phone]. Topic: Quick Communication - Other Results >> Jan 17, 2018  1:02 PM Laural BenesJohnson, Louisianahiquita C wrote: Pt's spouse is calling in for lab results.   CB: [phone]

## 2018-02-05 ENCOUNTER — Encounter: Payer: Self-pay | Admitting: Physician Assistant

## 2018-03-23 ENCOUNTER — Telehealth: Payer: Self-pay | Admitting: Physician Assistant

## 2018-03-23 NOTE — Telephone Encounter (Signed)
Called pt to try and reschedule her appt with McVey on 04/20/18. McVey will not be available that day.   If pt. Calls back, please reschedule her at her convenience.  Thank you!

## 2018-04-20 ENCOUNTER — Encounter: Payer: BLUE CROSS/BLUE SHIELD | Admitting: Physician Assistant

## 2018-04-27 ENCOUNTER — Encounter: Payer: BLUE CROSS/BLUE SHIELD | Admitting: Physician Assistant

## 2018-05-12 ENCOUNTER — Encounter: Payer: BLUE CROSS/BLUE SHIELD | Admitting: Family Medicine

## 2018-11-13 ENCOUNTER — Encounter: Payer: BLUE CROSS/BLUE SHIELD | Admitting: Family Medicine

## 2019-01-11 ENCOUNTER — Encounter: Payer: BLUE CROSS/BLUE SHIELD | Admitting: Family Medicine

## 2019-02-13 ENCOUNTER — Telehealth: Payer: Self-pay | Admitting: Family Medicine

## 2019-02-13 ENCOUNTER — Other Ambulatory Visit: Payer: Self-pay | Admitting: *Deleted

## 2019-02-13 DIAGNOSIS — Z20822 Contact with and (suspected) exposure to covid-19: Secondary | ICD-10-CM

## 2019-02-13 NOTE — Telephone Encounter (Signed)
Reason for call:  °LVM with patient to return call to be scheduled for COVID, Katie Mcmanama, NP ordering provider and the department is [facility] department.   °

## 2019-02-13 NOTE — Telephone Encounter (Signed)
Pt's appt is 7.6.20 at 8:30am at [facility]

## 2019-02-14 ENCOUNTER — Other Ambulatory Visit: Payer: Self-pay

## 2019-02-14 DIAGNOSIS — Z20822 Contact with and (suspected) exposure to covid-19: Secondary | ICD-10-CM

## 2019-02-14 DIAGNOSIS — R6889 Other general symptoms and signs: Secondary | ICD-10-CM | POA: Diagnosis not present

## 2019-02-18 ENCOUNTER — Other Ambulatory Visit: Payer: Self-pay

## 2019-02-20 LAB — NOVEL CORONAVIRUS, NAA: SARS-CoV-2, NAA: NOT DETECTED

## 2019-03-06 ENCOUNTER — Other Ambulatory Visit: Payer: Self-pay | Admitting: Family Medicine

## 2019-03-06 ENCOUNTER — Other Ambulatory Visit: Payer: Self-pay

## 2019-03-06 DIAGNOSIS — Z20822 Contact with and (suspected) exposure to covid-19: Secondary | ICD-10-CM

## 2019-03-06 DIAGNOSIS — R6889 Other general symptoms and signs: Secondary | ICD-10-CM | POA: Diagnosis not present

## 2019-03-10 LAB — NOVEL CORONAVIRUS, NAA: SARS-CoV-2, NAA: NOT DETECTED

## 2019-05-27 ENCOUNTER — Encounter: Payer: Self-pay | Admitting: Family Medicine

## 2019-05-27 ENCOUNTER — Other Ambulatory Visit: Payer: Self-pay

## 2019-05-27 ENCOUNTER — Ambulatory Visit (INDEPENDENT_AMBULATORY_CARE_PROVIDER_SITE_OTHER): Payer: BC Managed Care – PPO | Admitting: Family Medicine

## 2019-05-27 VITALS — BP 101/68 | HR 77 | Temp 98.9°F | Ht 64.5 in | Wt 142.2 lb

## 2019-05-27 DIAGNOSIS — R21 Rash and other nonspecific skin eruption: Secondary | ICD-10-CM | POA: Diagnosis not present

## 2019-05-27 DIAGNOSIS — L280 Lichen simplex chronicus: Secondary | ICD-10-CM

## 2019-05-27 LAB — POCT SKIN KOH: Skin KOH, POC: NEGATIVE

## 2019-05-27 MED ORDER — DOXEPIN HCL 10 MG PO CAPS: 10.0000 mg | ORAL_CAPSULE | Freq: Every day | ORAL | 2 refills | Status: DC

## 2019-05-27 MED ORDER — FLUCONAZOLE 150 MG PO TABS: 300.0000 mg | ORAL_TABLET | ORAL | 0 refills | Status: DC

## 2019-05-27 NOTE — Progress Notes (Signed)
   10/12/202010:35 AM  LAWANNA CECERE [birth year], 48 y.o., female [id number]  Chief Complaint  Patient presents with  . Rash    received tx for neck rash some yrs ago, rash has not gotten better. Burning and itching sometime in the face    HPI:   Patient is a 48 y.o. female who presents today for rash on neck, face, arms and back  chronic Rash has been spreading Very itchy Has been told in the past that she had eczema - using topical steroids does not help Uses lemon and cool compresses - help with the itchiness Saw derm, bx [id number] - lichen simplex chronicus Wants another referral to derm   Depression screen [facility] 2/9 05/27/2019 10/02/2017 05/13/2017  Decreased Interest 0 0 0  Down, Depressed, Hopeless 0 0 0  PHQ - 2 Score 0 0 0    Fall Risk  05/27/2019 10/02/2017 05/13/2017 08/19/2016 07/12/2016  Falls in the past year? 0 No No No No  Number falls in past yr: 0 - - - -  Injury with Fall? 0 - - - -     No Known Allergies  Prior to Admission medications   Not on File    Past Medical History:  Diagnosis Date  . Eczema   . Neuromuscular disorder [facility])     Past Surgical History:  Procedure Laterality Date  . CESAREAN SECTION     one section     Social History   Tobacco Use  . Smoking status: Never Smoker  . Smokeless tobacco: Never Used  Substance Use Topics  . Alcohol use: No    History reviewed. No pertinent family history.  ROS Per hpi  OBJECTIVE:  Today's Vitals   05/27/19 1030  BP: 101/68  Pulse: 77  Temp: 98.9 F (37.2 C)  SpO2: 97%  Weight: 142 lb 3.2 oz (64.5 kg)  Height: 5' 4.5" (1.638 m)   Body mass index is 24.03 kg/m.   Physical Exam   GEN: AAOx3, NAD Skin: large hyperpigmented patches along right neck, flexural forearms, back.   Results for orders placed or performed in visit on 05/27/19 (from the past 24 hour(s))  POCT Skin KOH     Status: None   Collection Time: 05/27/19 10:59 AM  Result Value Ref Range   Skin KOH, POC  Negative Negative    No results found.   ASSESSMENT and PLAN  1. Lichen simplex chronicus Referring to derm as requested. Doxepin to help break itch-scratch cycle. - Ambulatory referral to Dermatology  2. Rash and nonspecific skin eruption - POCT Skin KOH  Other orders - doxepin (SINEQUAN) 10 MG capsule; Take 1-2 capsules (10-20 mg total) by mouth at bedtime.  Return if symptoms worsen or fail to improve.    Rutherford Guys, MD Primary Care at [address], [place] [address] Ph.  [phone] Fax [phone]

## 2019-05-27 NOTE — Patient Instructions (Signed)
° ° ° °  If you have lab work done today you will be contacted with your lab results within the next 2 weeks.  If you have not heard from us then please contact us. The fastest way to get your results is to register for My Chart. ° ° °IF you received an x-ray today, you will receive an invoice from [facility]. Please contact  Radiology at [phone] with questions or concerns regarding your invoice.  ° °IF you received labwork today, you will receive an invoice from [facility]. Please contact [facility] at [phone] with questions or concerns regarding your invoice.  ° °Our billing staff will not be able to assist you with questions regarding bills from these companies. ° °You will be contacted with the lab results as soon as they are available. The fastest way to get your results is to activate your My Chart account. Instructions are located on the last page of this paperwork. If you have not heard from us regarding the results in 2 weeks, please contact this office. °  ° ° ° °

## 2019-07-01 DIAGNOSIS — L811 Chloasma: Secondary | ICD-10-CM | POA: Diagnosis not present

## 2019-08-23 DIAGNOSIS — L2089 Other atopic dermatitis: Secondary | ICD-10-CM | POA: Diagnosis not present

## 2019-08-23 DIAGNOSIS — L281 Prurigo nodularis: Secondary | ICD-10-CM | POA: Diagnosis not present

## 2019-10-25 ENCOUNTER — Ambulatory Visit (INDEPENDENT_AMBULATORY_CARE_PROVIDER_SITE_OTHER): Payer: BLUE CROSS/BLUE SHIELD | Admitting: Family Medicine

## 2019-10-25 ENCOUNTER — Other Ambulatory Visit: Payer: Self-pay

## 2019-10-25 ENCOUNTER — Encounter: Payer: Self-pay | Admitting: Family Medicine

## 2019-10-25 VITALS — BP 106/68 | HR 61 | Temp 98.0°F | Ht 64.5 in | Wt 140.0 lb

## 2019-10-25 DIAGNOSIS — Z1329 Encounter for screening for other suspected endocrine disorder: Secondary | ICD-10-CM

## 2019-10-25 DIAGNOSIS — J302 Other seasonal allergic rhinitis: Secondary | ICD-10-CM

## 2019-10-25 DIAGNOSIS — Z0001 Encounter for general adult medical examination with abnormal findings: Secondary | ICD-10-CM | POA: Diagnosis not present

## 2019-10-25 DIAGNOSIS — E559 Vitamin D deficiency, unspecified: Secondary | ICD-10-CM

## 2019-10-25 DIAGNOSIS — Z130 Encounter for screening for diseases of the blood and blood-forming organs and certain disorders involving the immune mechanism: Secondary | ICD-10-CM

## 2019-10-25 DIAGNOSIS — Z13220 Encounter for screening for lipoid disorders: Secondary | ICD-10-CM | POA: Diagnosis not present

## 2019-10-25 DIAGNOSIS — Z0000 Encounter for general adult medical examination without abnormal findings: Secondary | ICD-10-CM

## 2019-10-25 DIAGNOSIS — Z13228 Encounter for screening for other metabolic disorders: Secondary | ICD-10-CM | POA: Diagnosis not present

## 2019-10-25 MED ORDER — OLOPATADINE HCL 0.2 % OP SOLN: 1.0000 [drp] | Freq: Every day | OPHTHALMIC | 5 refills | Status: DC

## 2019-10-25 MED ORDER — FLUTICASONE PROPIONATE 50 MCG/ACT NA SUSP: 1.0000 | Freq: Two times a day (BID) | NASAL | 6 refills | Status: DC

## 2019-10-25 NOTE — Patient Instructions (Addendum)
   If you have lab work done today you will be contacted with your lab results within the next 2 weeks.  If you have not heard from us then please contact us. The fastest way to get your results is to register for My Chart.   IF you received an x-ray today, you will receive an invoice from [facility]. Please contact [facility] at [phone] with questions or concerns regarding your invoice.   IF you received labwork today, you will receive an invoice from [facility]. Please contact [facility] at [phone] with questions or concerns regarding your invoice.   Our billing staff will not be able to assist you with questions regarding bills from these companies.  You will be contacted with the lab results as soon as they are available. The fastest way to get your results is to activate your My Chart account. Instructions are located on the last page of this paperwork. If you have not heard from us regarding the results in 2 weeks, please contact this office.     Preventive Care 40-64 Years Old, Female Preventive care refers to visits with your health care provider and lifestyle choices that can promote health and wellness. This includes:  A yearly physical exam. This may also be called an annual well check.  Regular dental visits and eye exams.  Immunizations.  Screening for certain conditions.  Healthy lifestyle choices, such as eating a healthy diet, getting regular exercise, not using drugs or products that contain nicotine and tobacco, and limiting alcohol use. What can I expect for my preventive care visit? Physical exam Your health care provider will check your:  Height and weight. This may be used to calculate body mass index (BMI), which tells if you are at a healthy weight.  Heart rate and blood pressure.  Skin for abnormal spots. Counseling Your health care provider may ask you questions about your:  Alcohol, tobacco, and drug use.  Emotional  well-being.  Home and relationship well-being.  Sexual activity.  Eating habits.  Work and work environment.  Method of birth control.  Menstrual cycle.  Pregnancy history. What immunizations do I need?  Influenza (flu) vaccine  This is recommended every year. Tetanus, diphtheria, and pertussis (Tdap) vaccine  You may need a Td booster every 10 years. Varicella (chickenpox) vaccine  You may need this if you have not been vaccinated. Zoster (shingles) vaccine  You may need this after age 60. Measles, mumps, and rubella (MMR) vaccine  You may need at least one dose of MMR if you were born in 1957 or later. You may also need a second dose. Pneumococcal conjugate (PCV13) vaccine  You may need this if you have certain conditions and were not previously vaccinated. Pneumococcal polysaccharide (PPSV23) vaccine  You may need one or two doses if you smoke cigarettes or if you have certain conditions. Meningococcal conjugate (MenACWY) vaccine  You may need this if you have certain conditions. Hepatitis A vaccine  You may need this if you have certain conditions or if you travel or work in places where you may be exposed to hepatitis A. Hepatitis B vaccine  You may need this if you have certain conditions or if you travel or work in places where you may be exposed to hepatitis B. Haemophilus influenzae type b (Hib) vaccine  You may need this if you have certain conditions. Human papillomavirus (HPV) vaccine  If recommended by your health care provider, you may need three doses over 6 months.   You may receive vaccines as individual doses or as more than one vaccine together in one shot (combination vaccines). Talk with your health care provider about the risks and benefits of combination vaccines. What tests do I need? Blood tests  Lipid and cholesterol levels. These may be checked every 5 years, or more frequently if you are over 50 years old.  Hepatitis C  test.  Hepatitis B test. Screening  Lung cancer screening. You may have this screening every year starting at age 55 if you have a 30-pack-year history of smoking and currently smoke or have quit within the past 15 years.  Colorectal cancer screening. All adults should have this screening starting at age 50 and continuing until age 75. Your health care provider may recommend screening at age 45 if you are at increased risk. You will have tests every 1-10 years, depending on your results and the type of screening test.  Diabetes screening. This is done by checking your blood sugar (glucose) after you have not eaten for a while (fasting). You may have this done every 1-3 years.  Mammogram. This may be done every 1-2 years. Talk with your health care provider about when you should start having regular mammograms. This may depend on whether you have a family history of breast cancer.  BRCA-related cancer screening. This may be done if you have a family history of breast, ovarian, tubal, or peritoneal cancers.  Pelvic exam and Pap test. This may be done every 3 years starting at age 21. Starting at age 30, this may be done every 5 years if you have a Pap test in combination with an HPV test. Other tests  Sexually transmitted disease (STD) testing.  Bone density scan. This is done to screen for osteoporosis. You may have this scan if you are at high risk for osteoporosis. Follow these instructions at home: Eating and drinking  Eat a diet that includes fresh fruits and vegetables, whole grains, lean protein, and low-fat dairy.  Take vitamin and mineral supplements as recommended by your health care provider.  Do not drink alcohol if: ? Your health care provider tells you not to drink. ? You are pregnant, may be pregnant, or are planning to become pregnant.  If you drink alcohol: ? Limit how much you have to 0-1 drink a day. ? Be aware of how much alcohol is in your drink. In the [place] one  drink equals one 12 oz bottle of beer (355 mL), one 5 oz glass of wine (148 mL), or one 1 oz glass of hard liquor (44 mL). Lifestyle  Take daily care of your teeth and gums.  Stay active. Exercise for at least 30 minutes on 5 or more days each week.  Do not use any products that contain nicotine or tobacco, such as cigarettes, e-cigarettes, and chewing tobacco. If you need help quitting, ask your health care provider.  If you are sexually active, practice safe sex. Use a condom or other form of birth control (contraception) in order to prevent pregnancy and STIs (sexually transmitted infections).  If told by your health care provider, take low-dose aspirin daily starting at age 50. What's next?  Visit your health care provider once a year for a well check visit.  Ask your health care provider how often you should have your eyes and teeth checked.  Stay up to date on all vaccines. This information is not intended to replace advice given to you by your health care provider. Make sure   you discuss any questions you have with your health care provider. Document Revised: 04/12/2018 Document Reviewed: 04/12/2018 Elsevier Patient Education  2020 Elsevier Inc.  

## 2019-10-25 NOTE — Progress Notes (Signed)
3/12/202111:21 AM  [name] [birth year], 49 y.o., female [id number]  Chief Complaint  Patient presents with  . Annual Exam    HPI:   Patient is a 49 y.o. female with past medical history significant for eczema who presents today for CPE  Last CPE 2018 Cervical Cancer Screening: last pap 2016, normal, declines pap today Breast Cancer Screening: declines Colorectal Cancer Screening: declines Bone Density Testing: at age 8 HIV Screening: 2019 STI Screening: 2016 Seasonal Influenza Vaccination: declines Td/Tdap Vaccination: declines Pneumococcal Vaccination: at age 30 Zoster Vaccination: at age 78 Frequency of Dental evaluation: needs to make appt, has no current concerns Frequency of Eye evaluation: wears eyeglasses, last eye exam was within last year   Hearing Screening   125Hz  250Hz  500Hz  1000Hz  2000Hz  3000Hz  4000Hz  6000Hz  8000Hz   Right ear:           Left ear:             Visual Acuity Screening   Right eye Left eye Both eyes  Without correction:     With correction: 2025 2030 2020    Depression screen [facility] 2/9 10/25/2019 05/27/2019 10/02/2017  Decreased Interest 0 0 0  Down, Depressed, Hopeless 0 0 0  PHQ - 2 Score 0 0 0    Fall Risk  10/25/2019 05/27/2019 10/02/2017 05/13/2017 08/19/2016  Falls in the past year? 0 0 No No No  Number falls in past yr: 0 0 - - -  Injury with Fall? 0 0 - - -  Follow up Falls evaluation completed - - - -     No Known Allergies  Prior to Admission medications   Medication Sig Start Date End Date Taking? Authorizing Provider  doxepin (SINEQUAN) 10 MG capsule Take 1-2 capsules (10-20 mg total) by mouth at bedtime. 05/27/19  Yes Rutherford Guys, MD  tacrolimus (PROTOPIC) 0.1 % ointment Apply 1 application topically 2 (two) times daily. 07/15/19  Yes [provider]    Past Medical History:  Diagnosis Date  . Eczema   . Neuromuscular disorder [facility])     Past Surgical History:  Procedure Laterality Date  .  CESAREAN SECTION     one section     Social History   Tobacco Use  . Smoking status: Never Smoker  . Smokeless tobacco: Never Used  Substance Use Topics  . Alcohol use: No    No family history on file.  Review of Systems  Constitutional: Negative for chills, fever, malaise/fatigue and weight loss.  HENT: Negative for hearing loss and tinnitus.   Eyes: Negative for blurred vision and double vision.  Respiratory: Negative for cough and shortness of breath.   Cardiovascular: Negative for chest pain, palpitations and leg swelling.  Gastrointestinal: Positive for constipation. Negative for abdominal pain, blood in stool, melena, nausea and vomiting.  Genitourinary: Negative for dysuria, frequency and urgency.  Musculoskeletal: Positive for joint pain.  Neurological: Negative for dizziness, tingling and headaches.  Endo/Heme/Allergies: Positive for environmental allergies. Negative for polydipsia.  Psychiatric/Behavioral: Negative for depression. The patient is not nervous/anxious and does not have insomnia.   All other systems reviewed and are negative.    OBJECTIVE:  Today's Vitals   10/25/19 1115  BP: 106/68  Pulse: 61  Temp: 98 F (36.7 C)  SpO2: 98%  Weight: 140 lb (63.5 kg)  Height: 5' 4.5" (1.638 m)   Body mass index is 23.66 kg/m.   Physical Exam Vitals and nursing note reviewed.  Constitutional:  Appearance: She is well-developed.  HENT:     Head: Normocephalic and atraumatic.     Right Ear: Hearing, tympanic membrane, ear canal and external ear normal.     Left Ear: Hearing, tympanic membrane, ear canal and external ear normal.     Mouth/Throat:     Mouth: Mucous membranes are moist.     Pharynx: No oropharyngeal exudate or posterior oropharyngeal erythema.  Eyes:     Extraocular Movements: Extraocular movements intact.     Conjunctiva/sclera: Conjunctivae normal.     Pupils: Pupils are equal, round, and reactive to light.  Neck:     Thyroid: No  thyromegaly.  Cardiovascular:     Rate and Rhythm: Normal rate and regular rhythm.     Heart sounds: Normal heart sounds. No murmur. No friction rub. No gallop.   Pulmonary:     Effort: Pulmonary effort is normal.     Breath sounds: Normal breath sounds. No wheezing, rhonchi or rales.  Abdominal:     General: Bowel sounds are normal. There is no distension.     Palpations: Abdomen is soft. There is no hepatomegaly, splenomegaly or mass.     Tenderness: There is no abdominal tenderness.  Musculoskeletal:        General: Normal range of motion.     Cervical back: Neck supple.     Right lower leg: No edema.     Left lower leg: No edema.  Lymphadenopathy:     Cervical: No cervical adenopathy.  Skin:    General: Skin is warm and dry.  Neurological:     Mental Status: She is alert and oriented to person, place, and time.     Cranial Nerves: No cranial nerve deficit.     Gait: Gait normal.     Deep Tendon Reflexes: Reflexes are normal and symmetric.  Psychiatric:        Mood and Affect: Mood normal.        Behavior: Behavior normal.     No results found for this or any previous visit (from the past 24 hour(s)).  No results found.   ASSESSMENT and PLAN  1. Annual physical exam No concerns per history or exam. Routine HCM labs ordered. HCM reviewed/discussed. Patient declines all cancer screenings and immunizations. Anticipatory guidance regarding healthy weight, lifestyle and choices given.   2. Screening for lipid disorders - Lipid panel  3. Screening for endocrine, metabolic and immunity disorder - CMP14+EGFR  4. Avitaminosis D - Vitamin D, 25-hydroxy  5. Seasonal allergies - fluticasone (FLONASE) 50 MCG/ACT nasal spray; Place 1 spray into both nostrils 2 (two) times daily. - Olopatadine HCl 0.2 % SOLN; Apply 1 drop to eye daily.  Return in about 1 year (around 10/24/2020).    Rutherford Guys, MD Primary Care at [place], [place] [address] Ph.   [phone] Fax [phone]

## 2019-10-26 LAB — CMP14+EGFR
ALT: 12 IU/L (ref 0–32)
AST: 18 IU/L (ref 0–40)
Albumin/Globulin Ratio: 1.5 (ref 1.2–2.2)
Albumin: 4.3 g/dL (ref 3.8–4.8)
Alkaline Phosphatase: 62 IU/L (ref 39–117)
BUN/Creatinine Ratio: 26 — ABNORMAL HIGH (ref 9–23)
BUN: 16 mg/dL (ref 6–24)
Bilirubin Total: 0.3 mg/dL (ref 0.0–1.2)
CO2: 24 mmol/L (ref 20–29)
Calcium: 9.6 mg/dL (ref 8.7–10.2)
Chloride: 103 mmol/L (ref 96–106)
Creatinine, Ser: 0.62 mg/dL (ref 0.57–1.00)
GFR calc Af Amer: 122 mL/min/{1.73_m2} (ref >59)
GFR calc non Af Amer: 106 mL/min/{1.73_m2} (ref >59)
Globulin, Total: 2.8 g/dL (ref 1.5–4.5)
Glucose: 88 mg/dL (ref 65–99)
Potassium: 4.4 mmol/L (ref 3.5–5.2)
Sodium: 142 mmol/L (ref 134–144)
Total Protein: 7.1 g/dL (ref 6.0–8.5)

## 2019-10-26 LAB — VITAMIN D 25 HYDROXY (VIT D DEFICIENCY, FRACTURES): Vit D, 25-Hydroxy: 13.5 ng/mL — ABNORMAL LOW (ref 30.0–100.0)

## 2019-10-26 LAB — LIPID PANEL
Chol/HDL Ratio: 2.6 ratio (ref 0.0–4.4)
Cholesterol, Total: 161 mg/dL (ref 100–199)
HDL: 61 mg/dL (ref >39)
LDL Chol Calc (NIH): 91 mg/dL (ref 0–99)
Triglycerides: 43 mg/dL (ref 0–149)
VLDL Cholesterol Cal: 9 mg/dL (ref 5–40)

## 2019-11-14 ENCOUNTER — Other Ambulatory Visit: Payer: Self-pay | Admitting: Family Medicine

## 2019-11-14 MED ORDER — VITAMIN D (ERGOCALCIFEROL) 1.25 MG (50000 UNIT) PO CAPS: 50000.0000 [IU] | ORAL_CAPSULE | ORAL | 0 refills | Status: DC

## 2019-11-14 NOTE — Progress Notes (Signed)
Called pt left message .

## 2020-02-13 ENCOUNTER — Other Ambulatory Visit: Payer: Self-pay | Admitting: Family Medicine

## 2020-02-13 NOTE — Telephone Encounter (Signed)
Requested  medications are  due for refill today yes  Requested medications are on the active medication list yes  Last refill 4/1  Notes to clinic Not Delegated

## 2020-08-25 ENCOUNTER — Telehealth (INDEPENDENT_AMBULATORY_CARE_PROVIDER_SITE_OTHER): Payer: BC Managed Care – PPO | Admitting: Registered Nurse

## 2020-08-25 ENCOUNTER — Other Ambulatory Visit: Payer: Self-pay

## 2020-08-25 DIAGNOSIS — Z20822 Contact with and (suspected) exposure to covid-19: Secondary | ICD-10-CM

## 2020-08-25 NOTE — Progress Notes (Signed)
Telemedicine Encounter- SOAP NOTE Established Patient  This telephone encounter was conducted with the patient's (or proxy's) verbal consent via audio telecommunications: yes  Patient was instructed to have this encounter in a suitably private space; and to only have persons present to whom they give permission to participate. In addition, patient identity was confirmed by use of name plus two identifiers (DOB and address).  I discussed the limitations, risks, security and privacy concerns of performing an evaluation and management service by telephone and the availability of in person appointments. I also discussed with the patient that there may be a patient responsible charge related to this service. The patient expressed understanding and agreed to proceed.  I spent a total of 15 minutes talking with the patient or their proxy.  Patient at home Provider in office  Chief Complaint  Patient presents with  . Cough    Pt has had cough with some back pain, headache, feels warm, productive cough some congestion     Subjective   [name] is a 50 y.o. established patient. Telephone visit today for cough  HPI Cough, back pain, feverish feeling, and mild mucus production. At this time feverish feeling and congestion has stopped Still having mild cough Still having some back pain Denies urinary or GI symptoms No shob or doe No chest pain No sick contacts No one else at home has these sxs  Needs test to return to work  Patient Active Problem List   Diagnosis Date Noted  . Eczema 06/18/2014  . DDD (degenerative disc disease), lumbosacral 10/03/2013  . Avitaminosis D 07/31/2013  . Irregular menses on the progesterone only minipill 09/02/2011  . ARTHRITIS, GENERALIZED 04/23/2007  . ABDOMINAL PAIN, CHRONIC 04/20/2007    Past Medical History:  Diagnosis Date  . Eczema   . Neuromuscular disorder [facility])     Current Outpatient Medications  Medication Sig Dispense Refill   . doxepin (SINEQUAN) 10 MG capsule Take 1-2 capsules (10-20 mg total) by mouth at bedtime. 60 capsule 2  . fluticasone (FLONASE) 50 MCG/ACT nasal spray Place 1 spray into both nostrils 2 (two) times daily. 16 g 6  . Olopatadine HCl 0.2 % SOLN Apply 1 drop to eye daily. 2.5 mL 5  . tacrolimus (PROTOPIC) 0.1 % ointment Apply 1 application topically 2 (two) times daily.    . Vitamin D, Ergocalciferol, (DRISDOL) 1.25 MG (50000 UNIT) CAPS capsule Take 1 capsule (50,000 Units total) by mouth every 7 (seven) days. 12 capsule 0   No current facility-administered medications for this visit.    No Known Allergies  Social History   Socioeconomic History  . Marital status: Married    Spouse name: Not on file  . Number of children: Not on file  . Years of education: Not on file  . Highest education level: Not on file  Occupational History  . Not on file  Tobacco Use  . Smoking status: Never Smoker  . Smokeless tobacco: Never Used  Substance and Sexual Activity  . Alcohol use: No  . Drug use: No  . Sexual activity: Yes    Birth control/protection: Pill  Other Topics Concern  . Not on file  Social History Narrative  . Not on file   Social Determinants of Health   Financial Resource Strain: Not on file  Food Insecurity: Not on file  Transportation Needs: Not on file  Physical Activity: Not on file  Stress: Not on file  Social Connections: Not on file  Intimate  Partner Violence: Not on file    ROS  Objective   Vitals as reported by the patient: There were no vitals filed for this visit.  Lexani was seen today for cough.  Diagnoses and all orders for this visit:  Suspected COVID-19 virus infection -     Novel Coronavirus, NAA ([facility])   PLAN  Pt to present for swab  Return to work as indicated by test result  Continue supportive care at home  Patient encouraged to call clinic with any questions, comments, or concerns.  I discussed the assessment and treatment  plan with the patient. The patient was provided an opportunity to ask questions and all were answered. The patient agreed with the plan and demonstrated an understanding of the instructions.   The patient was advised to call back or seek an in-person evaluation if the symptoms worsen or if the condition fails to improve as anticipated.  I provided 15 minutes of non-face-to-face time during this encounter.  Janeece Agee, NP  Primary Care at [facility]

## 2020-08-25 NOTE — Patient Instructions (Signed)
° ° ° °  If you have lab work done today you will be contacted with your lab results within the next 2 weeks.  If you have not heard from us then please contact us. The fastest way to get your results is to register for My Chart. ° ° °IF you received an x-ray today, you will receive an invoice from [facility]. Please contact [facility] at [phone] with questions or concerns regarding your invoice.  ° °IF you received labwork today, you will receive an invoice from [facility]. Please contact [facility] at [phone] with questions or concerns regarding your invoice.  ° °Our billing staff will not be able to assist you with questions regarding bills from these companies. ° °You will be contacted with the lab results as soon as they are available. The fastest way to get your results is to activate your My Chart account. Instructions are located on the last page of this paperwork. If you have not heard from us regarding the results in 2 weeks, please contact this office. °  ° ° ° °

## 2020-08-28 LAB — NOVEL CORONAVIRUS, NAA: SARS-CoV-2, NAA: DETECTED — AB

## 2020-08-28 NOTE — Progress Notes (Signed)
Please call patient -   Covid is positive, needs to isolate and supportive care. If symptoms are worsening or failing to improve, please call clinic or seek in person assessment.  Thank you  Rich

## 2020-09-07 DIAGNOSIS — Z20822 Contact with and (suspected) exposure to covid-19: Secondary | ICD-10-CM | POA: Diagnosis not present

## 2021-02-11 ENCOUNTER — Encounter: Payer: Self-pay | Admitting: Emergency Medicine

## 2021-02-11 ENCOUNTER — Ambulatory Visit (INDEPENDENT_AMBULATORY_CARE_PROVIDER_SITE_OTHER): Payer: BC Managed Care – PPO | Admitting: Emergency Medicine

## 2021-02-11 ENCOUNTER — Other Ambulatory Visit: Payer: Self-pay | Admitting: Emergency Medicine

## 2021-02-11 ENCOUNTER — Other Ambulatory Visit: Payer: Self-pay

## 2021-02-11 VITALS — BP 100/88 | HR 57 | Temp 97.7°F | Ht 66.0 in | Wt 136.8 lb

## 2021-02-11 DIAGNOSIS — Z7689 Persons encountering health services in other specified circumstances: Secondary | ICD-10-CM

## 2021-02-11 DIAGNOSIS — Z13220 Encounter for screening for lipoid disorders: Secondary | ICD-10-CM | POA: Diagnosis not present

## 2021-02-11 DIAGNOSIS — L309 Dermatitis, unspecified: Secondary | ICD-10-CM | POA: Diagnosis not present

## 2021-02-11 DIAGNOSIS — M5137 Other intervertebral disc degeneration, lumbosacral region: Secondary | ICD-10-CM

## 2021-02-11 DIAGNOSIS — L280 Lichen simplex chronicus: Secondary | ICD-10-CM | POA: Diagnosis not present

## 2021-02-11 DIAGNOSIS — E559 Vitamin D deficiency, unspecified: Secondary | ICD-10-CM

## 2021-02-11 DIAGNOSIS — Z1211 Encounter for screening for malignant neoplasm of colon: Secondary | ICD-10-CM

## 2021-02-11 DIAGNOSIS — Z1231 Encounter for screening mammogram for malignant neoplasm of breast: Secondary | ICD-10-CM

## 2021-02-11 LAB — COMPREHENSIVE METABOLIC PANEL
ALT: 13 U/L (ref 0–35)
AST: 18 U/L (ref 0–37)
Albumin: 4.2 g/dL (ref 3.5–5.2)
Alkaline Phosphatase: 49 U/L (ref 39–117)
BUN: 17 mg/dL (ref 6–23)
CO2: 29 mEq/L (ref 19–32)
Calcium: 9.5 mg/dL (ref 8.4–10.5)
Chloride: 105 mEq/L (ref 96–112)
Creatinine, Ser: 0.57 mg/dL (ref 0.40–1.20)
GFR: 105.44 mL/min (ref >60.00)
Glucose, Bld: 93 mg/dL (ref 70–99)
Potassium: 4.4 mEq/L (ref 3.5–5.1)
Sodium: 140 mEq/L (ref 135–145)
Total Bilirubin: 0.3 mg/dL (ref 0.2–1.2)
Total Protein: 7.3 g/dL (ref 6.0–8.3)

## 2021-02-11 LAB — CBC WITH DIFFERENTIAL/PLATELET
Basophils Absolute: 0 10*3/uL (ref 0.0–0.1)
Basophils Relative: 0.9 % (ref 0.0–3.0)
Eosinophils Absolute: 0.1 10*3/uL (ref 0.0–0.7)
Eosinophils Relative: 2.1 % (ref 0.0–5.0)
HCT: 35.7 % — ABNORMAL LOW (ref 36.0–46.0)
Hemoglobin: 12.2 g/dL (ref 12.0–15.0)
Lymphocytes Relative: 45.8 % (ref 12.0–46.0)
Lymphs Abs: 1.8 10*3/uL (ref 0.7–4.0)
MCHC: 34.2 g/dL (ref 30.0–36.0)
MCV: 85.2 fl (ref 78.0–100.0)
Monocytes Absolute: 0.3 10*3/uL (ref 0.1–1.0)
Monocytes Relative: 9.1 % (ref 3.0–12.0)
Neutro Abs: 1.6 10*3/uL (ref 1.4–7.7)
Neutrophils Relative %: 42.1 % — ABNORMAL LOW (ref 43.0–77.0)
Platelets: 225 10*3/uL (ref 150.0–400.0)
RBC: 4.19 Mil/uL (ref 3.87–5.11)
RDW: 13.4 % (ref 11.5–15.5)
WBC: 3.8 10*3/uL — ABNORMAL LOW (ref 4.0–10.5)

## 2021-02-11 LAB — LIPID PANEL
Cholesterol: 156 mg/dL (ref 0–200)
HDL: 57.7 mg/dL (ref >39.00)
LDL Cholesterol: 87 mg/dL (ref 0–99)
NonHDL: 98.15
Total CHOL/HDL Ratio: 3
Triglycerides: 55 mg/dL (ref 0.0–149.0)
VLDL: 11 mg/dL (ref 0.0–40.0)

## 2021-02-11 LAB — VITAMIN D 25 HYDROXY (VIT D DEFICIENCY, FRACTURES): VITD: 20.68 ng/mL — ABNORMAL LOW (ref 30.00–100.00)

## 2021-02-11 MED ORDER — VITAMIN D (ERGOCALCIFEROL) 1.25 MG (50000 UNIT) PO CAPS: 50000.0000 [IU] | ORAL_CAPSULE | ORAL | 0 refills | Status: DC

## 2021-02-11 NOTE — Progress Notes (Signed)
[name] 50 y.o.   Chief Complaint  Patient presents with   New Patient (Initial Visit)    HISTORY OF PRESENT ILLNESS: This is a 50 y.o. female former patient of Dr. Leretha Pol here to establish care with me. Healthy female with a healthy lifestyle. Past medical history positive for chronic eczema diagnosed as lichen simplex chronicus.  Needs dermatology referral Needs GYN referral. Needs referral for mammogram and colonoscopy.  HPI   Prior to Admission medications   Medication Sig Start Date End Date Taking? Authorizing Provider  doxepin (SINEQUAN) 10 MG capsule Take 1-2 capsules (10-20 mg total) by mouth at bedtime. Patient not taking: Reported on 02/11/2021 05/27/19   Lezlie Lye, Meda Coffee, MD  fluticasone [facility]) 50 MCG/ACT nasal spray Place 1 spray into both nostrils 2 (two) times daily. Patient not taking: Reported on 02/11/2021 10/25/19   Lezlie Lye, Meda Coffee, MD  Olopatadine HCl 0.2 % SOLN Apply 1 drop to eye daily. Patient not taking: Reported on 02/11/2021 10/25/19   Lezlie Lye, Meda Coffee, MD  tacrolimus (PROTOPIC) 0.1 % ointment Apply 1 application topically 2 (two) times daily. Patient not taking: Reported on 02/11/2021 07/15/19   [provider]  Vitamin D, Ergocalciferol, (DRISDOL) 1.25 MG (50000 UNIT) CAPS capsule Take 1 capsule (50,000 Units total) by mouth every 7 (seven) days. Patient not taking: Reported on 02/11/2021 11/14/19   Lezlie Lye, Meda Coffee, MD    Not on File  Patient Active Problem List   Diagnosis Date Noted   Eczema 06/18/2014   DDD (degenerative disc disease), lumbosacral 10/03/2013   Avitaminosis D 07/31/2013   Irregular menses on the progesterone only minipill 09/02/2011   ARTHRITIS, GENERALIZED 04/23/2007   ABDOMINAL PAIN, CHRONIC 04/20/2007    Past Medical History:  Diagnosis Date   Eczema    Neuromuscular disorder (HCC)     Past Surgical History:  Procedure Laterality Date   CESAREAN SECTION     one section      Social History   Socioeconomic History   Marital status: Married    Spouse name: Not on file   Number of children: Not on file   Years of education: Not on file   Highest education level: Not on file  Occupational History   Not on file  Tobacco Use   Smoking status: Never   Smokeless tobacco: Never  Substance and Sexual Activity   Alcohol use: No   Drug use: No   Sexual activity: Yes    Birth control/protection: Pill  Other Topics Concern   Not on file  Social History Narrative   Not on file   Social Determinants of Health   Financial Resource Strain: Not on file  Food Insecurity: Not on file  Transportation Needs: Not on file  Physical Activity: Not on file  Stress: Not on file  Social Connections: Not on file  Intimate Partner Violence: Not on file    History reviewed. No pertinent family history.   Review of Systems  Constitutional: Negative.  Negative for chills and fever.  HENT: Negative.  Negative for congestion and sore throat.   Respiratory: Negative.  Negative for cough and shortness of breath.   Cardiovascular: Negative.  Negative for chest pain and palpitations.  Gastrointestinal:  Negative for abdominal pain, diarrhea, nausea and vomiting.  Genitourinary: Negative.  Negative for dysuria and hematuria.  Musculoskeletal: Negative.  Negative for back pain, myalgias and neck pain.  Skin:  Positive for rash (Chronic facial rash).  Neurological: Negative.  Negative for dizziness and headaches.  All other systems reviewed and are negative.  Today's Vitals   02/11/21 0912  BP: 100/88  Pulse: (!) 57  Temp: 97.7 F (36.5 C)  TempSrc: Oral  SpO2: 98%  Weight: 136 lb 12.8 oz (62.1 kg)  Height: 5\' 6"  (1.676 m)   Body mass index is 22.08 kg/m. Wt Readings from Last 3 Encounters:  02/11/21 136 lb 12.8 oz (62.1 kg)  10/25/19 140 lb (63.5 kg)  05/27/19 142 lb 3.2 oz (64.5 kg)    Physical Exam Vitals reviewed.  Constitutional:      Appearance:  Normal appearance.  HENT:     Head: Normocephalic.     Right Ear: Tympanic membrane, ear canal and external ear normal.     Left Ear: Tympanic membrane, ear canal and external ear normal.     Mouth/Throat:     Mouth: Mucous membranes are moist.     Pharynx: Oropharynx is clear.  Eyes:     Extraocular Movements: Extraocular movements intact.     Conjunctiva/sclera: Conjunctivae normal.     Pupils: Pupils are equal, round, and reactive to light.  Cardiovascular:     Rate and Rhythm: Normal rate and regular rhythm.     Pulses: Normal pulses.     Heart sounds: Normal heart sounds.  Pulmonary:     Effort: Pulmonary effort is normal.     Breath sounds: Normal breath sounds.  Musculoskeletal:        General: Normal range of motion.     Cervical back: Normal range of motion and neck supple.  Skin:    General: Skin is warm and dry.     Capillary Refill: Capillary refill takes less than 2 seconds.     Findings: Rash (Chronic hyperpigmented flat rash to both cheeks) present.  Neurological:     General: No focal deficit present.     Mental Status: She is alert and oriented to person, place, and time.  Psychiatric:        Mood and Affect: Mood normal.        Behavior: Behavior normal.     ASSESSMENT & PLAN: A total of 30 minutes was spent with the patient and counseling/coordination of care regarding establishing care with me, review of most recent office visit notes from Dr. Leretha PolSantiago, review of past medical history and chronic medical problems, review of medications, health maintenance items including need for mammogram and colonoscopy, need for complete GYN evaluation and referral, need for dermatology referral for chronic facial rash, review of most recent blood work results which showed low vitamin D levels, need for blood work today, education on nutrition, prognosis, documentation and need for follow-up.  Etta QuillKedija was seen today for new patient (initial visit).  Diagnoses and all  orders for this visit:  Chronic eczema -     Ambulatory referral to Dermatology -     Comprehensive metabolic panel -     CBC with Differential/Platelet  DDD (degenerative disc disease), lumbosacral  Avitaminosis D -     VITAMIN D 25 Hydroxy (Vit-D Deficiency, Fractures)  Lichen simplex chronicus -     Ambulatory referral to Dermatology  Colon cancer screening -     Ambulatory referral to Gastroenterology  Encounter to establish care -     Ambulatory referral to Gynecology  Encounter for screening mammogram for malignant neoplasm of breast -     MM Digital Screening; Future  Screening for lipoid disorders -     Lipid panel  Patient Instructions  Health Maintenance, Female Adopting a healthy lifestyle and getting preventive care are important in promoting health and wellness. Ask your health care provider about: The right schedule for you to have regular tests and exams. Things you can do on your own to prevent diseases and keep yourself healthy. What should I know about diet, weight, and exercise? Eat a healthy diet  Eat a diet that includes plenty of vegetables, fruits, low-fat dairy products, and lean protein. Do not eat a lot of foods that are high in solid fats, added sugars, or sodium.  Maintain a healthy weight Body mass index (BMI) is used to identify weight problems. It estimates body fat based on height and weight. Your health care provider can help determineyour BMI and help you achieve or maintain a healthy weight. Get regular exercise Get regular exercise. This is one of the most important things you can do for your health. Most adults should: Exercise for at least 150 minutes each week. The exercise should increase your heart rate and make you sweat (moderate-intensity exercise). Do strengthening exercises at least twice a week. This is in addition to the moderate-intensity exercise. Spend less time sitting. Even light physical activity can be  beneficial. Watch cholesterol and blood lipids Have your blood tested for lipids and cholesterol at 51 years of age, then havethis test every 5 years. Have your cholesterol levels checked more often if: Your lipid or cholesterol levels are high. You are older than 51 years of age. You are at high risk for heart disease. What should I know about cancer screening? Depending on your health history and family history, you may need to have cancer screening at various ages. This may include screening for: Breast cancer. Cervical cancer. Colorectal cancer. Skin cancer. Lung cancer. What should I know about heart disease, diabetes, and high blood pressure? Blood pressure and heart disease High blood pressure causes heart disease and increases the risk of stroke. This is more likely to develop in people who have high blood pressure readings, are of African descent, or are overweight. Have your blood pressure checked: Every 3-5 years if you are 40-39 years of age. Every year if you are 69 years old or older. Diabetes Have regular diabetes screenings. This checks your fasting blood sugar level. Have the screening done: Once every three years after age 28 if you are at a normal weight and have a low risk for diabetes. More often and at a younger age if you are overweight or have a high risk for diabetes. What should I know about preventing infection? Hepatitis B If you have a higher risk for hepatitis B, you should be screened for this virus. Talk with your health care provider to find out if you are at risk forhepatitis B infection. Hepatitis C Testing is recommended for: Everyone born from [birth year] through 1966. Anyone with known risk factors for hepatitis C. Sexually transmitted infections (STIs) Get screened for STIs, including gonorrhea and chlamydia, if: You are sexually active and are younger than 51 years of age. You are older than 51 years of age and your health care provider tells you  that you are at risk for this type of infection. Your sexual activity has changed since you were last screened, and you are at increased risk for chlamydia or gonorrhea. Ask your health care provider if you are at risk. Ask your health care provider about whether you are at high risk for HIV. Your health care provider may recommend a  prescription medicine to help prevent HIV infection. If you choose to take medicine to prevent HIV, you should first get tested for HIV. You should then be tested every 3 months for as long as you are taking the medicine. Pregnancy If you are about to stop having your period (premenopausal) and you may become pregnant, seek counseling before you get pregnant. Take 400 to 800 micrograms (mcg) of folic acid every day if you become pregnant. Ask for birth control (contraception) if you want to prevent pregnancy. Osteoporosis and menopause Osteoporosis is a disease in which the bones lose minerals and strength with aging. This can result in bone fractures. If you are 58 years old or older, or if you are at risk for osteoporosis and fractures, ask your health care provider if you should: Be screened for bone loss. Take a calcium or vitamin D supplement to lower your risk of fractures. Be given hormone replacement therapy (HRT) to treat symptoms of menopause. Follow these instructions at home: Lifestyle Do not use any products that contain nicotine or tobacco, such as cigarettes, e-cigarettes, and chewing tobacco. If you need help quitting, ask your health care provider. Do not use street drugs. Do not share needles. Ask your health care provider for help if you need support or information about quitting drugs. Alcohol use Do not drink alcohol if: Your health care provider tells you not to drink. You are pregnant, may be pregnant, or are planning to become pregnant. If you drink alcohol: Limit how much you use to 0-1 drink a day. Limit intake if you are  breastfeeding. Be aware of how much alcohol is in your drink. In the [place] one drink equals one 12 oz bottle of beer (355 mL), one 5 oz glass of wine (148 mL), or one 1 oz glass of hard liquor (44 mL). General instructions Schedule regular health, dental, and eye exams. Stay current with your vaccines. Tell your health care provider if: You often feel depressed. You have ever been abused or do not feel safe at home. Summary Adopting a healthy lifestyle and getting preventive care are important in promoting health and wellness. Follow your health care provider's instructions about healthy diet, exercising, and getting tested or screened for diseases. Follow your health care provider's instructions on monitoring your cholesterol and blood pressure. This information is not intended to replace advice given to you by your health care provider. Make sure you discuss any questions you have with your healthcare provider. Document Revised: 07/25/2018 Document Reviewed: 07/25/2018 Elsevier Patient Education  2022 Elsevier Inc.   Edwina Barth, MD [facility]

## 2021-02-11 NOTE — Patient Instructions (Signed)
Health Maintenance, Female Adopting a healthy lifestyle and getting preventive care are important in promoting health and wellness. Ask your health care provider about: The right schedule for you to have regular tests and exams. Things you can do on your own to prevent diseases and keep yourself healthy. What should I know about diet, weight, and exercise? Eat a healthy diet  Eat a diet that includes plenty of vegetables, fruits, low-fat dairy products, and lean protein. Do not eat a lot of foods that are high in solid fats, added sugars, or sodium.  Maintain a healthy weight Body mass index (BMI) is used to identify weight problems. It estimates body fat based on height and weight. Your health care provider can help determineyour BMI and help you achieve or maintain a healthy weight. Get regular exercise Get regular exercise. This is one of the most important things you can do for your health. Most adults should: Exercise for at least 150 minutes each week. The exercise should increase your heart rate and make you sweat (moderate-intensity exercise). Do strengthening exercises at least twice a week. This is in addition to the moderate-intensity exercise. Spend less time sitting. Even light physical activity can be beneficial. Watch cholesterol and blood lipids Have your blood tested for lipids and cholesterol at 51 years of age, then havethis test every 5 years. Have your cholesterol levels checked more often if: Your lipid or cholesterol levels are high. You are older than 51 years of age. You are at high risk for heart disease. What should I know about cancer screening? Depending on your health history and family history, you may need to have cancer screening at various ages. This may include screening for: Breast cancer. Cervical cancer. Colorectal cancer. Skin cancer. Lung cancer. What should I know about heart disease, diabetes, and high blood pressure? Blood pressure and heart  disease High blood pressure causes heart disease and increases the risk of stroke. This is more likely to develop in people who have high blood pressure readings, are of African descent, or are overweight. Have your blood pressure checked: Every 3-5 years if you are 18-39 years of age. Every year if you are 40 years old or older. Diabetes Have regular diabetes screenings. This checks your fasting blood sugar level. Have the screening done: Once every three years after age 40 if you are at a normal weight and have a low risk for diabetes. More often and at a younger age if you are overweight or have a high risk for diabetes. What should I know about preventing infection? Hepatitis B If you have a higher risk for hepatitis B, you should be screened for this virus. Talk with your health care provider to find out if you are at risk forhepatitis B infection. Hepatitis C Testing is recommended for: Everyone born from 1945 through 1965. Anyone with known risk factors for hepatitis C. Sexually transmitted infections (STIs) Get screened for STIs, including gonorrhea and chlamydia, if: You are sexually active and are younger than 51 years of age. You are older than 51 years of age and your health care provider tells you that you are at risk for this type of infection. Your sexual activity has changed since you were last screened, and you are at increased risk for chlamydia or gonorrhea. Ask your health care provider if you are at risk. Ask your health care provider about whether you are at high risk for HIV. Your health care provider may recommend a prescription medicine to help   prevent HIV infection. If you choose to take medicine to prevent HIV, you should first get tested for HIV. You should then be tested every 3 months for as long as you are taking the medicine. Pregnancy If you are about to stop having your period (premenopausal) and you may become pregnant, seek counseling before you get  pregnant. Take 400 to 800 micrograms (mcg) of folic acid every day if you become pregnant. Ask for birth control (contraception) if you want to prevent pregnancy. Osteoporosis and menopause Osteoporosis is a disease in which the bones lose minerals and strength with aging. This can result in bone fractures. If you are 65 years old or older, or if you are at risk for osteoporosis and fractures, ask your health care provider if you should: Be screened for bone loss. Take a calcium or vitamin D supplement to lower your risk of fractures. Be given hormone replacement therapy (HRT) to treat symptoms of menopause. Follow these instructions at home: Lifestyle Do not use any products that contain nicotine or tobacco, such as cigarettes, e-cigarettes, and chewing tobacco. If you need help quitting, ask your health care provider. Do not use street drugs. Do not share needles. Ask your health care provider for help if you need support or information about quitting drugs. Alcohol use Do not drink alcohol if: Your health care provider tells you not to drink. You are pregnant, may be pregnant, or are planning to become pregnant. If you drink alcohol: Limit how much you use to 0-1 drink a day. Limit intake if you are breastfeeding. Be aware of how much alcohol is in your drink. In the [place] one drink equals one 12 oz bottle of beer (355 mL), one 5 oz glass of wine (148 mL), or one 1 oz glass of hard liquor (44 mL). General instructions Schedule regular health, dental, and eye exams. Stay current with your vaccines. Tell your health care provider if: You often feel depressed. You have ever been abused or do not feel safe at home. Summary Adopting a healthy lifestyle and getting preventive care are important in promoting health and wellness. Follow your health care provider's instructions about healthy diet, exercising, and getting tested or screened for diseases. Follow your health care provider's  instructions on monitoring your cholesterol and blood pressure. This information is not intended to replace advice given to you by your health care provider. Make sure you discuss any questions you have with your healthcare provider. Document Revised: 07/25/2018 Document Reviewed: 07/25/2018 Elsevier Patient Education  2022 Elsevier Inc.  

## 2021-04-02 DIAGNOSIS — L2089 Other atopic dermatitis: Secondary | ICD-10-CM | POA: Diagnosis not present

## 2021-04-02 DIAGNOSIS — L811 Chloasma: Secondary | ICD-10-CM | POA: Diagnosis not present

## 2021-05-14 ENCOUNTER — Other Ambulatory Visit: Payer: Self-pay | Admitting: Emergency Medicine

## 2021-05-14 DIAGNOSIS — E559 Vitamin D deficiency, unspecified: Secondary | ICD-10-CM

## 2021-05-17 ENCOUNTER — Encounter: Payer: Self-pay | Admitting: Nurse Practitioner

## 2021-05-17 ENCOUNTER — Other Ambulatory Visit: Payer: Self-pay

## 2021-05-17 ENCOUNTER — Ambulatory Visit (INDEPENDENT_AMBULATORY_CARE_PROVIDER_SITE_OTHER): Payer: BC Managed Care – PPO | Admitting: Nurse Practitioner

## 2021-05-17 ENCOUNTER — Other Ambulatory Visit (HOSPITAL_COMMUNITY)
Admission: RE | Admit: 2021-05-17 | Discharge: 2021-05-17 | Disposition: A | Discharge status: Home / Self Care | Payer: BC Managed Care – PPO | Source: Ambulatory Visit | Attending: Nurse Practitioner | Admitting: Nurse Practitioner

## 2021-05-17 VITALS — BP 118/76 | Ht 64.5 in | Wt 137.0 lb

## 2021-05-17 DIAGNOSIS — Z01419 Encounter for gynecological examination (general) (routine) without abnormal findings: Secondary | ICD-10-CM | POA: Insufficient documentation

## 2021-05-17 DIAGNOSIS — Z780 Asymptomatic menopausal state: Secondary | ICD-10-CM

## 2021-05-17 NOTE — Progress Notes (Signed)
   [name] April 03, 1971 [id number]   History:  50 y.o. G2P2002 presents as new patient to establish care. No GYN complaints. Postmenopausal - no HRT, no bleeding. Normal pap history. Recently established with PCP Dr. Alvy Bimler who sent referrals for dermatology (history of lichen simplex chronicus), mammogram, and colonoscopy.   Gynecologic History Patient's last menstrual period was 05/02/2014.   Contraception/Family planning: status post hysterectomy Sexually active: Yes  Health Maintenance Last Pap: 04/06/2015. Results were: Normal Last mammogram: 2012. Results were: Normal Last colonoscopy: Never  Last Dexa: Not indicated  Past medical history, past surgical history, family history and social history were all reviewed and documented in the EPIC chart. Married. Works at [employer]. 55 yo daughter, 104 yo son.   ROS:  A ROS was performed and pertinent positives and negatives are included.  Exam:  Vitals:   05/17/21 1437  BP: 118/76  Weight: 137 lb (62.1 kg)  Height: 5' 4.5" (1.638 m)   Body mass index is 23.15 kg/m.  General appearance:  Normal Thyroid:  Symmetrical, normal in size, without palpable masses or nodularity. Respiratory  Auscultation:  Clear without wheezing or rhonchi Cardiovascular  Auscultation:  Regular rate, without rubs, murmurs or gallops  Edema/varicosities:  Not grossly evident Abdominal  Soft,nontender, without masses, guarding or rebound.  Liver/spleen:  No organomegaly noted  Hernia:  None appreciated  Skin  Inspection:  Grossly normal Breasts: Examined lying and sitting.   Right: Without masses, retractions, nipple discharge or axillary adenopathy.   Left: Without masses, retractions, nipple discharge or axillary adenopathy. Genitourinary   Inguinal/mons:  Normal without inguinal adenopathy  External genitalia:  Normal appearing vulva with no masses, tenderness, or lesions  BUS/Urethra/Skene's glands:  Normal  Vagina:  Normal  appearing with normal color and discharge, no lesions  Cervix:  Normal appearing without discharge or lesions  Uterus:  Normal in size, shape and contour.  Midline and mobile, nontender  Adnexa/parametria:     Rt: Normal in size, without masses or tenderness.   Lt: Normal in size, without masses or tenderness.  Anus and perineum: Normal  Digital rectal exam: Normal sphincter tone without palpated masses or tenderness   Patient informed chaperone available to be present for breast and pelvic exam. Patient has requested no chaperone to be present. Patient has been advised what will be completed during breast and pelvic exam.   Assessment/Plan:  50 y.o. G2P2002 to establish care.   Well female exam with routine gynecological exam - Plan: Cytology - PAP( ). Education provided on SBEs, importance of preventative screenings, current guidelines, high calcium diet, regular exercise, and multivitamin daily.  Labs with PCP.   Postmenopausal - no HRT, no bleeding.   Screening for cervical cancer - Normal Pap history.  Pap today.  Screening for breast cancer - Has not had screening mammogram since 2012. Discussed current guidelines and importance of preventative screenings.  Normal breast exam today.  Screening for colon cancer - Has not had screening colonoscopy. Discussed current guidelines and importance of preventative screenings.   Return in 1 year for annual.   Olivia Mackie DNP, 3:03 PM 05/17/2021

## 2021-05-18 LAB — CYTOLOGY - PAP
Comment: NEGATIVE
Diagnosis: NEGATIVE
High risk HPV: NEGATIVE

## 2021-05-20 DIAGNOSIS — R52 Pain, unspecified: Secondary | ICD-10-CM | POA: Diagnosis not present

## 2021-05-20 DIAGNOSIS — R1115 Cyclical vomiting syndrome unrelated to migraine: Secondary | ICD-10-CM | POA: Diagnosis not present

## 2021-05-20 DIAGNOSIS — R051 Acute cough: Secondary | ICD-10-CM | POA: Diagnosis not present

## 2021-05-20 DIAGNOSIS — Z20822 Contact with and (suspected) exposure to covid-19: Secondary | ICD-10-CM | POA: Diagnosis not present

## 2021-05-20 DIAGNOSIS — B349 Viral infection, unspecified: Secondary | ICD-10-CM | POA: Diagnosis not present

## 2021-05-20 DIAGNOSIS — R1110 Vomiting, unspecified: Secondary | ICD-10-CM | POA: Diagnosis not present

## 2021-05-24 DIAGNOSIS — R059 Cough, unspecified: Secondary | ICD-10-CM | POA: Diagnosis not present

## 2022-04-11 ENCOUNTER — Ambulatory Visit (INDEPENDENT_AMBULATORY_CARE_PROVIDER_SITE_OTHER): Payer: BC Managed Care – PPO | Admitting: Emergency Medicine

## 2022-04-11 ENCOUNTER — Encounter: Payer: Self-pay | Admitting: Emergency Medicine

## 2022-04-11 VITALS — BP 110/74 | HR 71 | Temp 98.2°F | Ht 64.5 in | Wt 132.1 lb

## 2022-04-11 DIAGNOSIS — Z1159 Encounter for screening for other viral diseases: Secondary | ICD-10-CM

## 2022-04-11 DIAGNOSIS — K2100 Gastro-esophageal reflux disease with esophagitis, without bleeding: Secondary | ICD-10-CM

## 2022-04-11 DIAGNOSIS — R1013 Epigastric pain: Secondary | ICD-10-CM

## 2022-04-11 LAB — CBC WITH DIFFERENTIAL/PLATELET
Basophils Absolute: 0 10*3/uL (ref 0.0–0.1)
Basophils Relative: 0.9 % (ref 0.0–3.0)
Eosinophils Absolute: 0 10*3/uL (ref 0.0–0.7)
Eosinophils Relative: 1.5 % (ref 0.0–5.0)
HCT: 38.3 % (ref 36.0–46.0)
Hemoglobin: 12.9 g/dL (ref 12.0–15.0)
Lymphocytes Relative: 47.5 % — ABNORMAL HIGH (ref 12.0–46.0)
Lymphs Abs: 1.4 10*3/uL (ref 0.7–4.0)
MCHC: 33.7 g/dL (ref 30.0–36.0)
MCV: 87.5 fl (ref 78.0–100.0)
Monocytes Absolute: 0.3 10*3/uL (ref 0.1–1.0)
Monocytes Relative: 10 % (ref 3.0–12.0)
Neutro Abs: 1.2 10*3/uL — ABNORMAL LOW (ref 1.4–7.7)
Neutrophils Relative %: 40.1 % — ABNORMAL LOW (ref 43.0–77.0)
Platelets: 230 10*3/uL (ref 150.0–400.0)
RBC: 4.38 Mil/uL (ref 3.87–5.11)
RDW: 13.6 % (ref 11.5–15.5)
WBC: 2.9 10*3/uL — ABNORMAL LOW (ref 4.0–10.5)

## 2022-04-11 LAB — COMPREHENSIVE METABOLIC PANEL
ALT: 16 U/L (ref 0–35)
AST: 20 U/L (ref 0–37)
Albumin: 4.5 g/dL (ref 3.5–5.2)
Alkaline Phosphatase: 62 U/L (ref 39–117)
BUN: 11 mg/dL (ref 6–23)
CO2: 30 mEq/L (ref 19–32)
Calcium: 10 mg/dL (ref 8.4–10.5)
Chloride: 103 mEq/L (ref 96–112)
Creatinine, Ser: 0.6 mg/dL (ref 0.40–1.20)
GFR: 103.3 mL/min (ref >60.00)
Glucose, Bld: 99 mg/dL (ref 70–99)
Potassium: 4.4 mEq/L (ref 3.5–5.1)
Sodium: 139 mEq/L (ref 135–145)
Total Bilirubin: 0.5 mg/dL (ref 0.2–1.2)
Total Protein: 7.8 g/dL (ref 6.0–8.3)

## 2022-04-11 LAB — LIPASE: Lipase: 14 U/L (ref 11.0–59.0)

## 2022-04-11 MED ORDER — PANTOPRAZOLE SODIUM 40 MG PO TBEC: 40.0000 mg | DELAYED_RELEASE_TABLET | Freq: Every day | ORAL | 3 refills | Status: DC

## 2022-04-11 NOTE — Assessment & Plan Note (Signed)
Clinically stable.  Recent onset of symptoms. No red flag signs or symptoms. We will start pantoprazole 40 mg daily for the next couple of weeks. Diet and nutrition discussed. May need GI consult for upper endoscopy. Follow-up in 4 weeks

## 2022-04-11 NOTE — Patient Instructions (Signed)
Gastroesophageal Reflux Disease, Adult  Gastroesophageal reflux (GER) happens when acid from the stomach flows up into the tube that connects the mouth and the stomach (esophagus). Normally, food travels down the esophagus and stays in the stomach to be digested. With GER, food and stomach acid sometimes move back up into the esophagus. You may have a disease called gastroesophageal reflux disease (GERD) if the reflux: Happens often. Causes frequent or very bad symptoms. Causes problems such as damage to the esophagus. When this happens, the esophagus becomes sore and swollen. Over time, GERD can make small holes (ulcers) in the lining of the esophagus. What are the causes? This condition is caused by a problem with the muscle between the esophagus and the stomach. When this muscle is weak or not normal, it does not close properly to keep food and acid from coming back up from the stomach. The muscle can be weak because of: Tobacco use. Pregnancy. Having a certain type of hernia (hiatal hernia). Alcohol use. Certain foods and drinks, such as coffee, chocolate, onions, and peppermint. What increases the risk? Being overweight. Having a disease that affects your connective tissue. Taking NSAIDs, such a ibuprofen. What are the signs or symptoms? Heartburn. Difficult or painful swallowing. The feeling of having a lump in the throat. A bitter taste in the mouth. Bad breath. Having a lot of saliva. Having an upset or bloated stomach. Burping. Chest pain. Different conditions can cause chest pain. Make sure you see your doctor if you have chest pain. Shortness of breath or wheezing. A long-term cough or a cough at night. Wearing away of the surface of teeth (tooth enamel). Weight loss. How is this treated? Making changes to your diet. Taking medicine. Having surgery. Treatment will depend on how bad your symptoms are. Follow these instructions at home: Eating and drinking  Follow a  diet as told by your doctor. You may need to avoid foods and drinks such as: Coffee and tea, with or without caffeine. Drinks that contain alcohol. Energy drinks and sports drinks. Bubbly (carbonated) drinks or sodas. Chocolate and cocoa. Peppermint and mint flavorings. Garlic and onions. Horseradish. Spicy and acidic foods. These include peppers, chili powder, curry powder, vinegar, hot sauces, and BBQ sauce. Citrus fruit juices and citrus fruits, such as oranges, lemons, and limes. Tomato-based foods. These include red sauce, chili, salsa, and pizza with red sauce. Fried and fatty foods. These include donuts, french fries, potato chips, and high-fat dressings. High-fat meats. These include hot dogs, rib eye steak, sausage, ham, and bacon. High-fat dairy items, such as whole milk, butter, and cream cheese. Eat small meals often. Avoid eating large meals. Avoid drinking large amounts of liquid with your meals. Avoid eating meals during the 2-3 hours before bedtime. Avoid lying down right after you eat. Do not exercise right after you eat. Lifestyle  Do not smoke or use any products that contain nicotine or tobacco. If you need help quitting, ask your doctor. Try to lower your stress. If you need help doing this, ask your doctor. If you are overweight, lose an amount of weight that is healthy for you. Ask your doctor about a safe weight loss goal. General instructions Pay attention to any changes in your symptoms. Take over-the-counter and prescription medicines only as told by your doctor. Do not take aspirin, ibuprofen, or other NSAIDs unless your doctor says it is okay. Wear loose clothes. Do not wear anything tight around your waist. Raise (elevate) the head of your bed about   6 inches (15 cm). You may need to use a wedge to do this. Avoid bending over if this makes your symptoms worse. Keep all follow-up visits. Contact a doctor if: You have new symptoms. You lose weight and you  do not know why. You have trouble swallowing or it hurts to swallow. You have wheezing or a cough that keeps happening. You have a hoarse voice. Your symptoms do not get better with treatment. Get help right away if: You have sudden pain in your arms, neck, jaw, teeth, or back. You suddenly feel sweaty, dizzy, or light-headed. You have chest pain or shortness of breath. You vomit and the vomit is green, yellow, or black, or it looks like blood or coffee grounds. You faint. Your poop (stool) is red, bloody, or black. You cannot swallow, drink, or eat. These symptoms may represent a serious problem that is an emergency. Do not wait to see if the symptoms will go away. Get medical help right away. Call your local emergency services (911 in the [place] Do not drive yourself to the hospital. Summary If a person has gastroesophageal reflux disease (GERD), food and stomach acid move back up into the esophagus and cause symptoms or problems such as damage to the esophagus. Treatment will depend on how bad your symptoms are. Follow a diet as told by your doctor. Take all medicines only as told by your doctor. This information is not intended to replace advice given to you by your health care provider. Make sure you discuss any questions you have with your health care provider. Document Revised: 02/10/2020 Document Reviewed: 02/10/2020 Elsevier Patient Education  2023 [employer]vier Inc[employer]  

## 2022-04-11 NOTE — Assessment & Plan Note (Signed)
Differential diagnosis discussed. Diet and nutrition discussed Advised to avoid NSAIDs Stay well-hydrated Start pantoprazole 40 mg daily Blood work done today Follow-up in 4 weeks

## 2022-04-11 NOTE — Progress Notes (Signed)
[name] 51 y.o.   Chief Complaint  Patient presents with   Follow-up    indigestion pain in chest off and on     HISTORY OF PRESENT ILLNESS: This is a 51 y.o. female complaining of burning epigastric pain radiating up into her chest, mostly at nighttime for the past 3 weeks. Healthy female.  No cardiac history.  No medications. No recent dietary changes. Not taking NSAIDs. No other associated symptoms.  HPI   Prior to Admission medications   Medication Sig Start Date End Date Taking? Authorizing Provider  VITAMIN D PO Take by mouth.   Yes [provider]    No Known Allergies  Patient Active Problem List   Diagnosis Date Noted   Eczema 06/18/2014   DDD (degenerative disc disease), lumbosacral 10/03/2013   Avitaminosis D 07/31/2013   Irregular menses on the progesterone only minipill 09/02/2011   ARTHRITIS, GENERALIZED 04/23/2007   ABDOMINAL PAIN, CHRONIC 04/20/2007    Past Medical History:  Diagnosis Date   Eczema    Neuromuscular disorder (HCC)     Past Surgical History:  Procedure Laterality Date   CESAREAN SECTION     one section     Social History   Socioeconomic History   Marital status: Married    Spouse name: Not on file   Number of children: Not on file   Years of education: Not on file   Highest education level: Not on file  Occupational History   Not on file  Tobacco Use   Smoking status: Never   Smokeless tobacco: Never  Substance and Sexual Activity   Alcohol use: No   Drug use: No   Sexual activity: Yes    Birth control/protection: None  Other Topics Concern   Not on file  Social History Narrative   Not on file   Social Determinants of Health   Financial Resource Strain: Not on file  Food Insecurity: Not on file  Transportation Needs: Not on file  Physical Activity: Not on file  Stress: Not on file  Social Connections: Not on file  Intimate Partner Violence: Not on file    No family history on  file.   Review of Systems  Constitutional: Negative.  Negative for chills and fever.  HENT: Negative.  Negative for congestion and sore throat.   Respiratory: Negative.  Negative for cough and shortness of breath.   Cardiovascular: Negative.  Negative for chest pain and palpitations.  Gastrointestinal:  Positive for heartburn. Negative for abdominal pain, blood in stool, diarrhea, melena, nausea and vomiting.  Genitourinary: Negative.   Musculoskeletal: Negative.   Skin: Negative.  Negative for rash.  Neurological: Negative.  Negative for dizziness and headaches.  All other systems reviewed and are negative.  Today's Vitals   04/11/22 0955  BP: 110/74  Pulse: 71  Temp: 98.2 F (36.8 C)  TempSrc: Oral  SpO2: 99%  Weight: 132 lb 2 oz (59.9 kg)  Height: 5' 4.5" (1.638 m)   Body mass index is 22.33 kg/m.  Physical Exam Vitals reviewed.  Constitutional:      Appearance: Normal appearance.  HENT:     Head: Normocephalic.     Mouth/Throat:     Mouth: Mucous membranes are moist.     Pharynx: Oropharynx is clear.  Eyes:     Extraocular Movements: Extraocular movements intact.     Conjunctiva/sclera: Conjunctivae normal.     Pupils: Pupils are equal, round, and reactive to light.  Cardiovascular:  Rate and Rhythm: Normal rate and regular rhythm.     Pulses: Normal pulses.     Heart sounds: Normal heart sounds.  Pulmonary:     Effort: Pulmonary effort is normal.  Abdominal:     General: There is no distension.     Palpations: Abdomen is soft.     Tenderness: There is no abdominal tenderness.  Musculoskeletal:     Cervical back: No tenderness.     Right lower leg: No edema.     Left lower leg: No edema.  Lymphadenopathy:     Cervical: No cervical adenopathy.  Skin:    General: Skin is warm and dry.  Neurological:     Mental Status: She is alert.    ASSESSMENT & PLAN: A total of 44 minutes was spent with the patient and counseling/coordination of care regarding  preparing for this visit, review of most recent office visit notes, review of most recent blood work results, differential diagnosis of epigastric pain, possible diagnosis of GERD and management, education on nutrition, need for blood work, prognosis, documentation, and need for follow-up in 4 weeks.  Problem List Items Addressed This Visit       Digestive   Gastroesophageal reflux disease with esophagitis without hemorrhage - Primary    Clinically stable.  Recent onset of symptoms. No red flag signs or symptoms. We will start pantoprazole 40 mg daily for the next couple of weeks. Diet and nutrition discussed. May need GI consult for upper endoscopy. Follow-up in 4 weeks      Relevant Medications   pantoprazole (PROTONIX) 40 MG tablet     Other   Epigastric pain    Differential diagnosis discussed. Diet and nutrition discussed Advised to avoid NSAIDs Stay well-hydrated Start pantoprazole 40 mg daily Blood work done today Follow-up in 4 weeks      Relevant Medications   pantoprazole (PROTONIX) 40 MG tablet   Other Relevant Orders   CBC with Differential/Platelet   Comprehensive metabolic panel   Lipase   Patient Instructions  Gastroesophageal Reflux Disease, Adult  Gastroesophageal reflux (GER) happens when acid from the stomach flows up into the tube that connects the mouth and the stomach (esophagus). Normally, food travels down the esophagus and stays in the stomach to be digested. With GER, food and stomach acid sometimes move back up into the esophagus. You may have a disease called gastroesophageal reflux disease (GERD) if the reflux: Happens often. Causes frequent or very bad symptoms. Causes problems such as damage to the esophagus. When this happens, the esophagus becomes sore and swollen. Over time, GERD can make small holes (ulcers) in the lining of the esophagus. What are the causes? This condition is caused by a problem with the muscle between the esophagus  and the stomach. When this muscle is weak or not normal, it does not close properly to keep food and acid from coming back up from the stomach. The muscle can be weak because of: Tobacco use. Pregnancy. Having a certain type of hernia (hiatal hernia). Alcohol use. Certain foods and drinks, such as coffee, chocolate, onions, and peppermint. What increases the risk? Being overweight. Having a disease that affects your connective tissue. Taking NSAIDs, such a ibuprofen. What are the signs or symptoms? Heartburn. Difficult or painful swallowing. The feeling of having a lump in the throat. A bitter taste in the mouth. Bad breath. Having a lot of saliva. Having an upset or bloated stomach. Burping. Chest pain. Different conditions can cause chest pain. Make  sure you see your doctor if you have chest pain. Shortness of breath or wheezing. A long-term cough or a cough at night. Wearing away of the surface of teeth (tooth enamel). Weight loss. How is this treated? Making changes to your diet. Taking medicine. Having surgery. Treatment will depend on how bad your symptoms are. Follow these instructions at home: Eating and drinking  Follow a diet as told by your doctor. You may need to avoid foods and drinks such as: Coffee and tea, with or without caffeine. Drinks that contain alcohol. Energy drinks and sports drinks. Bubbly (carbonated) drinks or sodas. Chocolate and cocoa. Peppermint and mint flavorings. Garlic and onions. Horseradish. Spicy and acidic foods. These include peppers, chili powder, curry powder, vinegar, hot sauces, and BBQ sauce. Citrus fruit juices and citrus fruits, such as oranges, lemons, and limes. Tomato-based foods. These include red sauce, chili, salsa, and pizza with red sauce. Fried and fatty foods. These include donuts, french fries, potato chips, and high-fat dressings. High-fat meats. These include hot dogs, rib eye steak, sausage, ham, and  bacon. High-fat dairy items, such as whole milk, butter, and cream cheese. Eat small meals often. Avoid eating large meals. Avoid drinking large amounts of liquid with your meals. Avoid eating meals during the 2-3 hours before bedtime. Avoid lying down right after you eat. Do not exercise right after you eat. Lifestyle  Do not smoke or use any products that contain nicotine or tobacco. If you need help quitting, ask your doctor. Try to lower your stress. If you need help doing this, ask your doctor. If you are overweight, lose an amount of weight that is healthy for you. Ask your doctor about a safe weight loss goal. General instructions Pay attention to any changes in your symptoms. Take over-the-counter and prescription medicines only as told by your doctor. Do not take aspirin, ibuprofen, or other NSAIDs unless your doctor says it is okay. Wear loose clothes. Do not wear anything tight around your waist. Raise (elevate) the head of your bed about 6 inches (15 cm). You may need to use a wedge to do this. Avoid bending over if this makes your symptoms worse. Keep all follow-up visits. Contact a doctor if: You have new symptoms. You lose weight and you do not know why. You have trouble swallowing or it hurts to swallow. You have wheezing or a cough that keeps happening. You have a hoarse voice. Your symptoms do not get better with treatment. Get help right away if: You have sudden pain in your arms, neck, jaw, teeth, or back. You suddenly feel sweaty, dizzy, or light-headed. You have chest pain or shortness of breath. You vomit and the vomit is green, yellow, or black, or it looks like blood or coffee grounds. You faint. Your poop (stool) is red, bloody, or black. You cannot swallow, drink, or eat. These symptoms may represent a serious problem that is an emergency. Do not wait to see if the symptoms will go away. Get medical help right away. Call your local emergency services (911  in the [place] Do not drive yourself to the hospital. Summary If a person has gastroesophageal reflux disease (GERD), food and stomach acid move back up into the esophagus and cause symptoms or problems such as damage to the esophagus. Treatment will depend on how bad your symptoms are. Follow a diet as told by your doctor. Take all medicines only as told by your doctor. This information is not intended to replace advice given  to you by your health care provider. Make sure you discuss any questions you have with your health care provider. Document Revised: 02/10/2020 Document Reviewed: 02/10/2020 Elsevier Patient Education  2023 Elsevier Inc.    Edwina Barth, MD [facility]

## 2023-03-13 ENCOUNTER — Encounter: Payer: Self-pay | Admitting: Family Medicine

## 2023-03-13 ENCOUNTER — Ambulatory Visit (INDEPENDENT_AMBULATORY_CARE_PROVIDER_SITE_OTHER): Payer: BC Managed Care – PPO | Admitting: Family Medicine

## 2023-03-13 VITALS — BP 90/68 | HR 70 | Temp 99.0°F | Resp 20 | Ht 64.5 in | Wt 131.0 lb

## 2023-03-13 DIAGNOSIS — U071 COVID-19: Secondary | ICD-10-CM | POA: Diagnosis not present

## 2023-03-13 DIAGNOSIS — R112 Nausea with vomiting, unspecified: Secondary | ICD-10-CM | POA: Diagnosis not present

## 2023-03-13 DIAGNOSIS — Z1152 Encounter for screening for COVID-19: Secondary | ICD-10-CM

## 2023-03-13 LAB — POC COVID19 BINAXNOW: SARS Coronavirus 2 Ag: POSITIVE — AB

## 2023-03-13 MED ORDER — ONDANSETRON 4 MG PO TBDP: 4.0000 mg | ORAL_TABLET | Freq: Three times a day (TID) | ORAL | 0 refills | Status: DC | PRN

## 2023-03-13 NOTE — Patient Instructions (Signed)
Ibuprofen for headache and muscle pain. Delsym or Robitussin for cough Zofran for nausea and vomiting

## 2023-03-13 NOTE — Progress Notes (Signed)
Assessment & Plan:  1. COVID-19 Education provided on COVID-19.  Discussed typical duration and progression of viral illnesses.  Encouraged symptom management including ibuprofen, Delsym/Robitussin, and Zofran.  Note provided for patient to remain out of work this week to allow for a 5-day quarantine.  2. Nausea and vomiting in adult - ondansetron (ZOFRAN-ODT) 4 MG disintegrating tablet; Take 1 tablet (4 mg total) by mouth every 8 (eight) hours as needed for nausea or vomiting.  Dispense: 20 tablet; Refill: 0  3. Encounter for screening for COVID-19 - POC COVID-19 BinaxNow  No results found for any visits on 03/13/23.   Follow up plan: Return if symptoms worsen or fail to improve.  Deliah Boston, MSN, APRN, FNP-C  Subjective:  HPI: [name] is a 52 y.o. female presenting on 03/13/2023 for Headache (HA and tension pain between shoulder and neck /Slight cough present /Vomiting x 1 last night //All S/S started yesterday )  Patient is accompanied by her daughter, who she is okay with being present.  Patient complains of cough, headache, vomiting, and pain between her shoulders/neck . She denies fever, shortness of breath, and wheezing. Onset of symptoms was 1 day ago, gradually worsening since that time. She is drinking plenty of fluids. Evaluation to date: none. Treatment to date:  Ibuprofen, which is helpful .  She does not smoke.     ROS: Negative unless specifically indicated above in HPI.   Relevant past medical history reviewed and updated as indicated.   Allergies and medications reviewed and updated.  No current outpatient medications on file.  No Known Allergies  Objective:   BP 90/68   Pulse 70   Temp 99 F (37.2 C)   Resp 20   Ht 5' 4.5" (1.638 m)   Wt 131 lb (59.4 kg)   LMP 05/02/2014 Comment: irregular cycle  BMI 22.14 kg/m    Physical Exam Vitals reviewed.  Constitutional:      General: She is not in acute distress.    Appearance: Normal  appearance. She is not ill-appearing, toxic-appearing or diaphoretic.  HENT:     Head: Normocephalic and atraumatic.     Right Ear: Tympanic membrane, ear canal and external ear normal. There is no impacted cerumen.     Left Ear: Tympanic membrane, ear canal and external ear normal. There is no impacted cerumen.     Nose: Nose normal. No congestion or rhinorrhea.     Mouth/Throat:     Mouth: Mucous membranes are moist.     Pharynx: Oropharynx is clear. Posterior oropharyngeal erythema present. No oropharyngeal exudate.  Eyes:     General: No scleral icterus.       Right eye: No discharge.        Left eye: No discharge.     Conjunctiva/sclera: Conjunctivae normal.  Cardiovascular:     Rate and Rhythm: Normal rate and regular rhythm.     Heart sounds: Normal heart sounds. No murmur heard.    No friction rub. No gallop.  Pulmonary:     Effort: Pulmonary effort is normal. No respiratory distress.     Breath sounds: Normal breath sounds. No stridor. No wheezing, rhonchi or rales.  Musculoskeletal:        General: Normal range of motion.     Cervical back: Normal range of motion.  Skin:    General: Skin is warm and dry.     Capillary Refill: Capillary refill takes less than 2 seconds.  Neurological:  General: No focal deficit present.     Mental Status: She is alert and oriented to person, place, and time. Mental status is at baseline.  Psychiatric:        Mood and Affect: Mood normal.        Behavior: Behavior normal.        Thought Content: Thought content normal.        Judgment: Judgment normal.

## 2023-04-12 ENCOUNTER — Ambulatory Visit (INDEPENDENT_AMBULATORY_CARE_PROVIDER_SITE_OTHER): Payer: BC Managed Care – PPO | Admitting: Emergency Medicine

## 2023-04-12 ENCOUNTER — Encounter: Payer: Self-pay | Admitting: Emergency Medicine

## 2023-04-12 VITALS — BP 106/78 | HR 64 | Temp 98.0°F | Ht 64.5 in | Wt 132.0 lb

## 2023-04-12 DIAGNOSIS — R102 Pelvic and perineal pain: Secondary | ICD-10-CM

## 2023-04-12 DIAGNOSIS — Z130 Encounter for screening for diseases of the blood and blood-forming organs and certain disorders involving the immune mechanism: Secondary | ICD-10-CM

## 2023-04-12 DIAGNOSIS — Z1211 Encounter for screening for malignant neoplasm of colon: Secondary | ICD-10-CM

## 2023-04-12 DIAGNOSIS — Z1329 Encounter for screening for other suspected endocrine disorder: Secondary | ICD-10-CM

## 2023-04-12 DIAGNOSIS — Z1231 Encounter for screening mammogram for malignant neoplasm of breast: Secondary | ICD-10-CM

## 2023-04-12 DIAGNOSIS — Z1159 Encounter for screening for other viral diseases: Secondary | ICD-10-CM

## 2023-04-12 DIAGNOSIS — Z0001 Encounter for general adult medical examination with abnormal findings: Secondary | ICD-10-CM | POA: Diagnosis not present

## 2023-04-12 DIAGNOSIS — Z13220 Encounter for screening for lipoid disorders: Secondary | ICD-10-CM | POA: Diagnosis not present

## 2023-04-12 DIAGNOSIS — Z13228 Encounter for screening for other metabolic disorders: Secondary | ICD-10-CM | POA: Diagnosis not present

## 2023-04-12 LAB — CBC WITH DIFFERENTIAL/PLATELET
Basophils Absolute: 0 10*3/uL (ref 0.0–0.1)
Basophils Relative: 0.5 % (ref 0.0–3.0)
Eosinophils Absolute: 0.1 10*3/uL (ref 0.0–0.7)
Eosinophils Relative: 1.6 % (ref 0.0–5.0)
HCT: 36.4 % (ref 36.0–46.0)
Hemoglobin: 12 g/dL (ref 12.0–15.0)
Lymphocytes Relative: 49.1 % — ABNORMAL HIGH (ref 12.0–46.0)
Lymphs Abs: 1.9 10*3/uL (ref 0.7–4.0)
MCHC: 33 g/dL (ref 30.0–36.0)
MCV: 87.9 fl (ref 78.0–100.0)
Monocytes Absolute: 0.3 10*3/uL (ref 0.1–1.0)
Monocytes Relative: 7.4 % (ref 3.0–12.0)
Neutro Abs: 1.6 10*3/uL (ref 1.4–7.7)
Neutrophils Relative %: 41.4 % — ABNORMAL LOW (ref 43.0–77.0)
Platelets: 203 10*3/uL (ref 150.0–400.0)
RBC: 4.14 Mil/uL (ref 3.87–5.11)
RDW: 13.6 % (ref 11.5–15.5)
WBC: 3.8 10*3/uL — ABNORMAL LOW (ref 4.0–10.5)

## 2023-04-12 LAB — URINALYSIS, ROUTINE W REFLEX MICROSCOPIC
Bilirubin Urine: NEGATIVE
Ketones, ur: NEGATIVE
Nitrite: NEGATIVE
Specific Gravity, Urine: 1.02 (ref 1.000–1.030)
Total Protein, Urine: NEGATIVE
Urine Glucose: NEGATIVE
Urobilinogen, UA: 0.2 (ref 0.0–1.0)
pH: 6 (ref 5.0–8.0)

## 2023-04-12 LAB — COMPREHENSIVE METABOLIC PANEL
ALT: 12 U/L (ref 0–35)
AST: 18 U/L (ref 0–37)
Albumin: 4.1 g/dL (ref 3.5–5.2)
Alkaline Phosphatase: 62 U/L (ref 39–117)
BUN: 15 mg/dL (ref 6–23)
CO2: 29 meq/L (ref 19–32)
Calcium: 9.8 mg/dL (ref 8.4–10.5)
Chloride: 104 meq/L (ref 96–112)
Creatinine, Ser: 0.51 mg/dL (ref 0.40–1.20)
GFR: 106.67 mL/min (ref >60.00)
Glucose, Bld: 98 mg/dL (ref 70–99)
Potassium: 4.1 meq/L (ref 3.5–5.1)
Sodium: 140 meq/L (ref 135–145)
Total Bilirubin: 0.3 mg/dL (ref 0.2–1.2)
Total Protein: 7.2 g/dL (ref 6.0–8.3)

## 2023-04-12 LAB — LIPID PANEL
Cholesterol: 147 mg/dL (ref 0–200)
HDL: 52.3 mg/dL (ref >39.00)
LDL Cholesterol: 84 mg/dL (ref 0–99)
NonHDL: 94.99
Total CHOL/HDL Ratio: 3
Triglycerides: 53 mg/dL (ref 0.0–149.0)
VLDL: 10.6 mg/dL (ref 0.0–40.0)

## 2023-04-12 LAB — HEMOGLOBIN A1C: Hgb A1c MFr Bld: 5.8 % (ref 4.6–6.5)

## 2023-04-12 NOTE — Assessment & Plan Note (Signed)
Clinically stable.  No red flag signs or symptoms. Chronic and affecting quality of life however. Needs gynecology evaluation. Referral placed today.

## 2023-04-12 NOTE — Progress Notes (Signed)
[name] 52 y.o.   Chief Complaint  Patient presents with   Annual Exam    Patient wants a referral to Gyn     HISTORY OF PRESENT ILLNESS: This is a 52 y.o. female here for annual exam. Accompanied by husband. Complaining of pelvic pain for about 1 month.  Requesting GYN referral No other complaints or medical concerns today.  HPI   Prior to Admission medications   Medication Sig Start Date End Date Taking? Authorizing Provider  ondansetron (ZOFRAN-ODT) 4 MG disintegrating tablet Take 1 tablet (4 mg total) by mouth every 8 (eight) hours as needed for nausea or vomiting. Patient not taking: Reported on 04/12/2023 03/13/23   Gwenlyn Fudge, FNP    No Known Allergies  Patient Active Problem List   Diagnosis Date Noted   DDD (degenerative disc disease), lumbosacral 10/03/2013   ARTHRITIS, GENERALIZED 04/23/2007    Past Medical History:  Diagnosis Date   Eczema    Neuromuscular disorder [facility])     Past Surgical History:  Procedure Laterality Date   CESAREAN SECTION     one section     Social History   Socioeconomic History   Marital status: Married    Spouse name: Not on file   Number of children: Not on file   Years of education: Not on file   Highest education level: Not on file  Occupational History   Not on file  Tobacco Use   Smoking status: Never   Smokeless tobacco: Never  Substance and Sexual Activity   Alcohol use: No   Drug use: No   Sexual activity: Yes    Birth control/protection: None  Other Topics Concern   Not on file  Social History Narrative   Not on file   Social Determinants of Health   Financial Resource Strain: Not on file  Food Insecurity: Not on file  Transportation Needs: Not on file  Physical Activity: Not on file  Stress: Not on file  Social Connections: Not on file  Intimate Partner Violence: Not on file    No family history on file.   Review of Systems  Constitutional: Negative.  Negative for chills and  fever.  HENT: Negative.  Negative for congestion and sore throat.   Respiratory: Negative.  Negative for cough and shortness of breath.   Cardiovascular: Negative.  Negative for chest pain and palpitations.  Gastrointestinal: Negative.  Negative for abdominal pain, diarrhea, nausea and vomiting.  Genitourinary: Negative.  Negative for dysuria and hematuria.  Skin: Negative.  Negative for rash.  Neurological: Negative.  Negative for dizziness and headaches.  All other systems reviewed and are negative.   Today's Vitals   04/12/23 1453  BP: 106/78  Pulse: 64  Temp: 98 F (36.7 C)  TempSrc: Oral  SpO2: 98%  Weight: 132 lb (59.9 kg)  Height: 5' 4.5" (1.638 m)   Body mass index is 22.31 kg/m.   Physical Exam Vitals reviewed.  Constitutional:      Appearance: Normal appearance.  HENT:     Head: Normocephalic.     Mouth/Throat:     Mouth: Mucous membranes are moist.     Pharynx: Oropharynx is clear.  Eyes:     Extraocular Movements: Extraocular movements intact.     Conjunctiva/sclera: Conjunctivae normal.     Pupils: Pupils are equal, round, and reactive to light.  Cardiovascular:     Rate and Rhythm: Normal rate and regular rhythm.     Pulses: Normal pulses.  Heart sounds: Normal heart sounds.  Pulmonary:     Effort: Pulmonary effort is normal.     Breath sounds: Normal breath sounds.  Abdominal:     Palpations: Abdomen is soft.     Tenderness: There is no abdominal tenderness.  Musculoskeletal:     Cervical back: No tenderness.  Lymphadenopathy:     Cervical: No cervical adenopathy.  Skin:    General: Skin is warm and dry.     Capillary Refill: Capillary refill takes less than 2 seconds.  Neurological:     General: No focal deficit present.     Mental Status: She is alert and oriented to person, place, and time.  Psychiatric:        Mood and Affect: Mood normal.        Behavior: Behavior normal.      ASSESSMENT & PLAN: Problem List Items Addressed  This Visit       Other   Pelvic pain    Clinically stable.  No red flag signs or symptoms. Chronic and affecting quality of life however. Needs gynecology evaluation. Referral placed today.      Relevant Orders   Ambulatory referral to Obstetrics / Gynecology   Other Visit Diagnoses     Encounter for general adult medical examination with abnormal findings    -  Primary   Relevant Orders   CBC with Differential   Comprehensive metabolic panel   Hemoglobin A1c   Lipid panel   TSH   Hepatitis C antibody screen   Need for hepatitis C screening test       Relevant Orders   Hepatitis C antibody screen   Visit for screening mammogram       Relevant Orders   MM Digital Screening   Colon cancer screening       Relevant Orders   Cologuard   Screening for deficiency anemia       Relevant Orders   CBC with Differential   Screening for lipoid disorders       Relevant Orders   Lipid panel   Screening for endocrine, metabolic and immunity disorder       Relevant Orders   Comprehensive metabolic panel   Hemoglobin A1c   TSH   Urinalysis      Modifiable risk factors discussed with patient. Anticipatory guidance according to age provided. The following topics were also discussed: Social Determinants of Health Smoking.  Non-smoker Diet and nutrition Benefits of exercise Cancer screening and need for colon and breast cancer screening Vaccinations review and recommendations Cardiovascular risk assessment and need for blood work Mental health including depression and anxiety Fall and accident prevention  Patient Instructions  Health Maintenance, Female Adopting a healthy lifestyle and getting preventive care are important in promoting health and wellness. Ask your health care provider about: The right schedule for you to have regular tests and exams. Things you can do on your own to prevent diseases and keep yourself healthy. What should I know about diet, weight, and  exercise? Eat a healthy diet  Eat a diet that includes plenty of vegetables, fruits, low-fat dairy products, and lean protein. Do not eat a lot of foods that are high in solid fats, added sugars, or sodium. Maintain a healthy weight Body mass index (BMI) is used to identify weight problems. It estimates body fat based on height and weight. Your health care provider can help determine your BMI and help you achieve or maintain a healthy weight. Get regular exercise Get  regular exercise. This is one of the most important things you can do for your health. Most adults should: Exercise for at least 150 minutes each week. The exercise should increase your heart rate and make you sweat (moderate-intensity exercise). Do strengthening exercises at least twice a week. This is in addition to the moderate-intensity exercise. Spend less time sitting. Even light physical activity can be beneficial. Watch cholesterol and blood lipids Have your blood tested for lipids and cholesterol at 53 years of age, then have this test every 5 years. Have your cholesterol levels checked more often if: Your lipid or cholesterol levels are high. You are older than 53 years of age. You are at high risk for heart disease. What should I know about cancer screening? Depending on your health history and family history, you may need to have cancer screening at various ages. This may include screening for: Breast cancer. Cervical cancer. Colorectal cancer. Skin cancer. Lung cancer. What should I know about heart disease, diabetes, and high blood pressure? Blood pressure and heart disease High blood pressure causes heart disease and increases the risk of stroke. This is more likely to develop in people who have high blood pressure readings or are overweight. Have your blood pressure checked: Every 3-5 years if you are 45-4 years of age. Every year if you are 71 years old or older. Diabetes Have regular diabetes  screenings. This checks your fasting blood sugar level. Have the screening done: Once every three years after age 46 if you are at a normal weight and have a low risk for diabetes. More often and at a younger age if you are overweight or have a high risk for diabetes. What should I know about preventing infection? Hepatitis B If you have a higher risk for hepatitis B, you should be screened for this virus. Talk with your health care provider to find out if you are at risk for hepatitis B infection. Hepatitis C Testing is recommended for: Everyone born from [birth year] through 1966. Anyone with known risk factors for hepatitis C. Sexually transmitted infections (STIs) Get screened for STIs, including gonorrhea and chlamydia, if: You are sexually active and are younger than 53 years of age. You are older than 53 years of age and your health care provider tells you that you are at risk for this type of infection. Your sexual activity has changed since you were last screened, and you are at increased risk for chlamydia or gonorrhea. Ask your health care provider if you are at risk. Ask your health care provider about whether you are at high risk for HIV. Your health care provider may recommend a prescription medicine to help prevent HIV infection. If you choose to take medicine to prevent HIV, you should first get tested for HIV. You should then be tested every 3 months for as long as you are taking the medicine. Pregnancy If you are about to stop having your period (premenopausal) and you may become pregnant, seek counseling before you get pregnant. Take 400 to 800 micrograms (mcg) of folic acid every day if you become pregnant. Ask for birth control (contraception) if you want to prevent pregnancy. Osteoporosis and menopause Osteoporosis is a disease in which the bones lose minerals and strength with aging. This can result in bone fractures. If you are 60 years old or older, or if you are at risk for  osteoporosis and fractures, ask your health care provider if you should: Be screened for bone loss. Take a calcium  or vitamin D supplement to lower your risk of fractures. Be given hormone replacement therapy (HRT) to treat symptoms of menopause. Follow these instructions at home: Alcohol use Do not drink alcohol if: Your health care provider tells you not to drink. You are pregnant, may be pregnant, or are planning to become pregnant. If you drink alcohol: Limit how much you have to: 0-1 drink a day. Know how much alcohol is in your drink. In the [place] one drink equals one 12 oz bottle of beer (355 mL), one 5 oz glass of wine (148 mL), or one 1 oz glass of hard liquor (44 mL). Lifestyle Do not use any products that contain nicotine or tobacco. These products include cigarettes, chewing tobacco, and vaping devices, such as e-cigarettes. If you need help quitting, ask your health care provider. Do not use street drugs. Do not share needles. Ask your health care provider for help if you need support or information about quitting drugs. General instructions Schedule regular health, dental, and eye exams. Stay current with your vaccines. Tell your health care provider if: You often feel depressed. You have ever been abused or do not feel safe at home. Summary Adopting a healthy lifestyle and getting preventive care are important in promoting health and wellness. Follow your health care provider's instructions about healthy diet, exercising, and getting tested or screened for diseases. Follow your health care provider's instructions on monitoring your cholesterol and blood pressure. This information is not intended to replace advice given to you by your health care provider. Make sure you discuss any questions you have with your health care provider. Document Revised: 12/21/2020 Document Reviewed: 12/21/2020 Elsevier Patient Education  2024 Elsevier Inc.     Edwina Barth,  MD [facility]

## 2023-04-12 NOTE — Patient Instructions (Signed)

## 2023-04-13 LAB — HEPATITIS C ANTIBODY: Hepatitis C Ab: NONREACTIVE

## 2023-04-13 LAB — TSH: TSH: 0.92 u[IU]/mL (ref 0.35–5.50)

## 2023-04-21 ENCOUNTER — Ambulatory Visit
Admission: RE | Admit: 2023-04-21 | Discharge: 2023-04-21 | Disposition: A | Discharge status: Home / Self Care | Payer: BC Managed Care – PPO | Source: Ambulatory Visit | Attending: Emergency Medicine

## 2023-04-21 DIAGNOSIS — Z1211 Encounter for screening for malignant neoplasm of colon: Secondary | ICD-10-CM | POA: Diagnosis not present

## 2023-04-21 DIAGNOSIS — Z1231 Encounter for screening mammogram for malignant neoplasm of breast: Secondary | ICD-10-CM | POA: Diagnosis not present

## 2023-04-26 LAB — COLOGUARD: COLOGUARD: NEGATIVE

## 2023-05-24 ENCOUNTER — Ambulatory Visit: Payer: BC Managed Care – PPO | Admitting: Obstetrics and Gynecology

## 2023-05-24 ENCOUNTER — Encounter: Payer: Self-pay | Admitting: Obstetrics and Gynecology

## 2023-05-24 VITALS — BP 110/73 | HR 59 | Wt 133.0 lb

## 2023-05-24 DIAGNOSIS — N819 Female genital prolapse, unspecified: Secondary | ICD-10-CM | POA: Diagnosis not present

## 2023-05-24 NOTE — Progress Notes (Signed)
   GYNECOLOGY PROGRESS NOTE  History:  52 y.o. G2P2002 presents to [facility] office today for problem gyn visit. She reports something protruding from vagina for the past two months as well as feeling of fullness. She reports no pain. Reports sometimes will occur daily or she will go a week or two without it occurring. She brought a picture with her that shows prolapse from the vagina. 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 When she rests or lays down reports resolution. Denies pain, incontinence, other vaginal or pelvic complaints.    The following portions of the patient's history were reviewed and updated as appropriate: allergies, current medications, past family history, past medical history, past social history, past surgical history and problem list. Last pap smear on 03/23/23 was normal  Health Maintenance Due  Topic Date Due   COVID-19 Vaccine (1 - 2023-24 season) Never done     Review of Systems:  Pertinent items are noted in HPI.   Objective:  Physical Exam Blood pressure 110/73, pulse (!) 59, weight 133 lb (60.3 kg), last menstrual period 05/02/2014. VS reviewed, nursing note reviewed,  Constitutional: well developed, well nourished, no distress HEENT: normocephalic Pulm/chest wall: normal effort Breast Exam: deferred Abdomen: soft Neuro: alert and oriented x 3 Skin: warm, dry Psych: affect normal Pelvic exam: Cervix pink, without lesion, vaginal walls and external genitalia normal Bimanual exam: neg CMT, uterus nontender, nonenlarged, adnexa without tenderness, mild buldge anterior portion   Assessment & Plan:  There are no diagnoses linked to this encounter. 1. Female genital prolapse, unspecified type Discussed option, potential need for pessary Will refer to Urogyn Note given to limit heavy lifting   - Ambulatory referral to Urogynecology   Victoria Grates, FNP

## 2023-05-24 NOTE — Progress Notes (Signed)
NGYN presents to establish care.  Last PAP 05/2021 Pt c/o possible prolapse for 2+ months

## 2023-05-26 ENCOUNTER — Encounter: Payer: Self-pay | Admitting: Obstetrics and Gynecology

## 2023-05-30 ENCOUNTER — Encounter: Payer: Self-pay | Admitting: Obstetrics and Gynecology

## 2023-06-27 ENCOUNTER — Encounter: Payer: Self-pay | Admitting: Obstetrics

## 2023-06-27 ENCOUNTER — Ambulatory Visit (INDEPENDENT_AMBULATORY_CARE_PROVIDER_SITE_OTHER): Payer: BC Managed Care – PPO | Admitting: Obstetrics

## 2023-06-27 VITALS — BP 107/71 | HR 67 | Ht 64.17 in | Wt 132.6 lb

## 2023-06-27 DIAGNOSIS — K5900 Constipation, unspecified: Secondary | ICD-10-CM | POA: Insufficient documentation

## 2023-06-27 DIAGNOSIS — N8110 Cystocele, unspecified: Secondary | ICD-10-CM | POA: Insufficient documentation

## 2023-06-27 LAB — POCT URINALYSIS DIPSTICK
Bilirubin, UA: NEGATIVE
Blood, UA: NEGATIVE
Glucose, UA: NEGATIVE
Ketones, UA: NEGATIVE
Leukocytes, UA: NEGATIVE
Nitrite, UA: NEGATIVE
Protein, UA: NEGATIVE
Spec Grav, UA: 1.01 (ref 1.010–1.025)
Urobilinogen, UA: 0.2 U/dL
pH, UA: 8 (ref 5.0–8.0)

## 2023-06-27 NOTE — Progress Notes (Signed)
New Patient Evaluation and Consultation  Referring Provider: Sue Lush, FNP PCP: Georgina Quint, MD Date of Service: 06/27/2023  Denies need for Amharic interpreter, prefer husband to translate  SUBJECTIVE Chief Complaint: New Patient (Initial Visit) ([name] is a 52 y.o. female here today for pelvic organ prolapse.)  History of Present Illness: [name] is a 52 y.o. White or Caucasian female seen in consultation at the request of NP Manson Passey for evaluation of pelvic organ prolapse.    Intermittent egg size vaginal bulge, denies vaginal bulge today. Work required lifting for 8 years. Notices bulge 1x/week in the afternoon.  Prior recommendation by OBGYN pessary for prolapse Prior 2013-2014 underwent PT for back pain from sitting prior to current job  Review of records significant for: History of lichen simplex chronicus, right sciatica   Urinary Symptoms: Does not leak urine.   Day time voids 6.  Nocturia: 1 times per night to void. Voiding dysfunction:  empties bladder well.  Patient does not use a catheter to empty bladder.  When urinating, patient feels she has no difficulties Drinks: 5 water bottles oz per day  UTIs:  0  UTI's in the last year.   Denies history of blood in urine, kidney or bladder stones, pyelonephritis, bladder cancer, and kidney cancer No results found for the last 90 days.   Pelvic Organ Prolapse Symptoms:                  Patient Admits to a feeling of a bulge the vaginal area. It has been present for 2 months.  Patient Denies seeing a bulge.  This bulge is bothersome.  Bowel Symptom: Bowel movements: 1 time(s) per day Stool consistency: hard Straining: yes every 1-3 months Splinting: no Incomplete evacuation: no.  Patient Denies accidental bowel leakage / fecal incontinence Bowel regimen: none Last colonoscopy: Date 04/21/23 negative cologuard, Results due in 1 year HM Colonoscopy          Postponed - Colonoscopy  (Every 10 Years) Postponed until 04/11/2024    No completion history exists for this topic.           Sexual Function Sexually active: yes.  Sexual orientation: Straight Pain with sex: has discomfort due to prolapse  Pelvic Pain Denies pelvic pain  Past Medical History:  Past Medical History:  Diagnosis Date   Eczema    Neuromuscular disorder (HCC)      Past Surgical History:   Past Surgical History:  Procedure Laterality Date   CESAREAN SECTION     one section      Past OB/GYN History: OB History  Gravida Para Term Preterm AB Living  2 2 2     2   SAB IAB Ectopic Multiple Live Births          2    # Outcome Date GA Lbr Len/2nd Weight Sex Type Anes PTL Lv  2 Term      CS-Unspec   LIV  1 Term      Vag-Spont   LIV    Vaginal deliveries: 1, largest infant 9lb with breech version,  Forceps/ Vacuum deliveries: 0, Cesarean section: 1 Menopausal: Yes, at age 37, denies bleeding a few years since 2nd delivery, Denies vaginal bleeding since menopause Contraception: n/a. Last pap smear.  Any history of abnormal pap smears: no.    Component Value Date/Time   DIAGPAP  05/17/2021 1450    - Negative for intraepithelial lesion or malignancy (NILM)   HPVHIGH Negative  05/17/2021 1450   ADEQPAP  05/17/2021 1450    Satisfactory for evaluation; transformation zone component PRESENT.    Medications: Patient currently has no medications in their medication list.   Allergies: Patient has No Known Allergies.   Social History:  Social History   Tobacco Use   Smoking status: Never   Smokeless tobacco: Never  Vaping Use   Vaping status: Never Used  Substance Use Topics   Alcohol use: No   Drug use: No    Relationship status: married Patient lives with her husband.   Patient is employed in [employer]. Regular exercise: No History of abuse: No  Family History:  History reviewed. No pertinent family history.   Review of Systems: Review of Systems  Constitutional:   Negative for fever, malaise/fatigue and weight loss.  Respiratory:  Negative for cough, shortness of breath and wheezing.   Cardiovascular:  Negative for chest pain, palpitations and leg swelling.  Gastrointestinal:  Positive for constipation. Negative for abdominal pain and blood in stool.  Genitourinary:  Negative for dysuria, flank pain, frequency, hematuria and urgency.  Skin:  Negative for rash.  Neurological:  Negative for dizziness, weakness and headaches.  Endo/Heme/Allergies:  Does not bruise/bleed easily.  Psychiatric/Behavioral:  Negative for depression. The patient is not nervous/anxious.      OBJECTIVE Physical Exam: Vitals:   06/27/23 0935  BP: 107/71  Pulse: 67  Weight: 132 lb 9.6 oz (60.1 kg)  Height: 5' 4.17" (1.63 m)    Physical Exam Constitutional:      General: She is not in acute distress.    Appearance: Normal appearance.  Genitourinary:     Bladder and urethral meatus normal.     No lesions in the vagina.     Right Labia: No rash, tenderness, lesions, skin changes or Bartholin's cyst.    Left Labia: No tenderness, lesions, skin changes, Bartholin's cyst or rash.    No vaginal discharge, erythema, tenderness, bleeding, ulceration or granulation tissue.     Moderate vaginal atrophy present.     Right Adnexa: not tender, not full and no mass present.    Left Adnexa: not tender, not full and no mass present.    No cervical motion tenderness, discharge, friability, lesion, polyp or nabothian cyst.     Uterus is not enlarged, fixed, tender or irregular.     No uterine mass detected.    Urethral meatus caruncle not present.    No urethral prolapse, tenderness, mass, hypermobility, discharge or stress urinary incontinence with cough stress test present.     Bladder is not tender, urgency on palpation not present and masses not present.      Pelvic Floor: Levator muscle strength is 5/5.    Levator ani not tender, obturator internus not tender, no asymmetrical  contractions present and no pelvic spasms present.    Symmetrical pelvic sensation, anal wink present and BC reflex present. Cardiovascular:     Rate and Rhythm: Normal rate.  Pulmonary:     Effort: Pulmonary effort is normal. No respiratory distress.  Abdominal:     General: There is no distension.     Palpations: There is no mass.     Tenderness: There is no abdominal tenderness.     Hernia: No hernia is present.    Neurological:     Mental Status: She is alert.  Vitals reviewed. Exam conducted with a chaperone present.     POP-Q:   POP-Q  1  Aa   1                                           Ba  -4                                              C   3                                            Gh  3                                            Pb  7                                            tvl   -1                                            Ap  -1                                            Bp  -5                                              D     Post-Void Residual (PVR) by Bladder Scan: In order to evaluate bladder emptying, we discussed obtaining a postvoid residual and patient agreed to this procedure.  Procedure: The ultrasound unit was placed on the patient's abdomen in the suprapubic region after the patient had voided.    Post Void Residual - 06/27/23 0937       Post Void Residual   Post Void Residual 34 mL              Laboratory Results: Lab Results  Component Value Date   COLORU Yellow 06/27/2023   CLARITYU Clear 06/27/2023   GLUCOSEUR Negative 06/27/2023   BILIRUBINUR Negative 06/27/2023   KETONESU Negative 06/27/2023   SPECGRAV 1.010 06/27/2023   RBCUR Negative 06/27/2023   PHUR 8.0 06/27/2023   PROTEINUR Negative 06/27/2023   UROBILINOGEN 0.2 06/27/2023   LEUKOCYTESUR Negative 06/27/2023    Lab Results  Component Value Date   CREATININE 0.51 04/12/2023   CREATININE 0.60 04/11/2022    CREATININE 0.57 02/11/2021    Lab Results  Component Value Date   HGBA1C 5.8 04/12/2023    Lab Results  Component Value Date   HGB 12.0 04/12/2023     ASSESSMENT AND PLAN Ms. Raycraft is a 52 y.o. with:  1. Pelvic organ prolapse quantification stage 2 cystocele  2. Constipation, unspecified constipation type     Pelvic organ prolapse quantification stage 2 cystocele Assessment & Plan: For treatment of pelvic organ prolapse, we discussed options for management including expectant management, conservative management, and surgical management, such as Kegels, a pessary, pelvic floor physical therapy, and specific surgical procedures. - reviewed options, patient desires to start with Kegel exercises - explained to patient possible benefits from pelvic floor PT to review body mechanics due to job requiring heavy lifting. Patient reports enjoyment from her job and desires to continue, encouraged to consider pelvic floor PT referral or pessary placement if refractory symptoms despite Kegel exercises. - bladder scan WNL with no sign or symptom of outlet obstruction  Orders: -     POCT urinalysis dipstick  Constipation, unspecified constipation type Assessment & Plan: - For constipation, we reviewed the importance of a better bowel regimen.  We also discussed the importance of avoiding chronic straining, as it can exacerbate her pelvic floor symptoms; we discussed treating constipation and straining prior to surgery, as postoperative straining can lead to damage to the repair and recurrence of symptoms. We discussed initiating therapy with increasing fluid intake, fiber supplementation, stool softeners, and laxatives such as miralax.  - start fiber supplementation - encouraged squatty potty and avoid straining for bowel movements   Time spent: I spent 73 minutes dedicated to the care of this patient on the date of this encounter to include pre-visit review of records, face-to-face time  with the patient discussing pelvic organ prolapse treatments, constipation, and post visit documentation.   Loleta Chance, MD

## 2023-06-27 NOTE — Patient Instructions (Signed)
You have a stage 3 (out of 4) prolapse.  We discussed the fact that it is not life threatening but there are several treatment options. For treatment of pelvic organ prolapse, we discussed options for management including expectant management, conservative management, and surgical management, such as Kegels, a pessary, pelvic floor physical therapy, and specific surgical procedures.     Constipation: Our goal is to achieve formed bowel movements daily or every-other-day.  You may need to try different combinations of the following options to find what works best for you - everybody's body works differently so feel free to adjust the dosages as needed.  Some options to help maintain bowel health include:  Dietary changes (more leafy greens, vegetables and fruits; less processed foods) Fiber supplementation (Benefiber, FiberCon, Metamucil or Psyllium). Start slow and increase gradually to full dose. Over-the-counter agents such as: stool softeners (Docusate or Colace) and/or laxatives (Miralax, milk of magnesia)  "Power Pudding" is a natural mixture that may help your constipation.  To make blend 1 cup applesauce, 1 cup wheat bran, and 3/4 cup prune juice, refrigerate and then take 1 tablespoon daily with a large glass of water as needed.   Women should try to eat at least 21 to 25 grams of fiber a day, while men should aim for 30 to 38 grams a day. You can add fiber to your diet with food or a fiber supplement such as psyllium (metamucil), benefiber, or fibercon.   Here's a look at how much dietary fiber is found in some common foods. When buying packaged foods, check the Nutrition Facts label for fiber content. It can vary among brands.  Fruits Serving size Total fiber (grams)*  Raspberries 1 cup 8.0  Pear 1 medium 5.5  Apple, with skin 1 medium 4.5  Banana 1 medium 3.0  Orange 1 medium 3.0  Strawberries 1 cup 3.0   Vegetables Serving size Total fiber (grams)*  Green peas, boiled 1 cup 9.0   Broccoli, boiled 1 cup chopped 5.0  Turnip greens, boiled 1 cup 5.0  Brussels sprouts, boiled 1 cup 4.0  Potato, with skin, baked 1 medium 4.0  Sweet corn, boiled 1 cup 3.5  Cauliflower, raw 1 cup chopped 2.0  Carrot, raw 1 medium 1.5   Grains Serving size Total fiber (grams)*  Spaghetti, whole-wheat, cooked 1 cup 6.0  Barley, pearled, cooked 1 cup 6.0  Bran flakes 3/4 cup 5.5  Quinoa, cooked 1 cup 5.0  Oat bran muffin 1 medium 5.0  Oatmeal, instant, cooked 1 cup 5.0  Popcorn, air-popped 3 cups 3.5  Brown rice, cooked 1 cup 3.5  Bread, whole-wheat 1 slice 2.0  Bread, rye 1 slice 2.0   Legumes, nuts and seeds Serving size Total fiber (grams)*  Split peas, boiled 1 cup 16.0  Lentils, boiled 1 cup 15.5  Black beans, boiled 1 cup 15.0  Baked beans, canned 1 cup 10.0  Chia seeds 1 ounce 10.0  Almonds 1 ounce (23 nuts) 3.5  Pistachios 1 ounce (49 nuts) 3.0  Sunflower kernels 1 ounce 3.0  *Rounded to nearest 0.5 gram. Source: [employer] for [employer]	[place]

## 2023-06-27 NOTE — Assessment & Plan Note (Addendum)
For treatment of pelvic organ prolapse, we discussed options for management including expectant management, conservative management, and surgical management, such as Kegels, a pessary, pelvic floor physical therapy, and specific surgical procedures. - reviewed options, patient desires to start with Kegel exercises - explained to patient possible benefits from pelvic floor PT to review body mechanics due to job requiring heavy lifting. Patient reports enjoyment from her job and desires to continue, encouraged to consider pelvic floor PT referral or pessary placement if refractory symptoms despite Kegel exercises. - bladder scan WNL with no sign or symptom of outlet obstruction

## 2023-06-27 NOTE — Assessment & Plan Note (Signed)
-   For constipation, we reviewed the importance of a better bowel regimen.  We also discussed the importance of avoiding chronic straining, as it can exacerbate her pelvic floor symptoms; we discussed treating constipation and straining prior to surgery, as postoperative straining can lead to damage to the repair and recurrence of symptoms. We discussed initiating therapy with increasing fluid intake, fiber supplementation, stool softeners, and laxatives such as miralax.  - start fiber supplementation - encouraged squatty potty and avoid straining for bowel movements

## 2023-10-06 DIAGNOSIS — M791 Myalgia, unspecified site: Secondary | ICD-10-CM | POA: Diagnosis not present

## 2023-10-09 ENCOUNTER — Encounter (HOSPITAL_COMMUNITY): Payer: Self-pay

## 2023-10-09 ENCOUNTER — Ambulatory Visit (HOSPITAL_COMMUNITY)
Admission: EM | Admit: 2023-10-09 | Discharge: 2023-10-09 | Disposition: A | Discharge status: Home / Self Care | Payer: BC Managed Care – PPO | Attending: Family Medicine | Admitting: Family Medicine

## 2023-10-09 ENCOUNTER — Ambulatory Visit (INDEPENDENT_AMBULATORY_CARE_PROVIDER_SITE_OTHER): Payer: BC Managed Care – PPO

## 2023-10-09 DIAGNOSIS — J189 Pneumonia, unspecified organism: Secondary | ICD-10-CM

## 2023-10-09 DIAGNOSIS — R051 Acute cough: Secondary | ICD-10-CM

## 2023-10-09 DIAGNOSIS — R079 Chest pain, unspecified: Secondary | ICD-10-CM | POA: Diagnosis not present

## 2023-10-09 MED ORDER — DOXYCYCLINE HYCLATE 100 MG PO CAPS: 100.0000 mg | ORAL_CAPSULE | Freq: Two times a day (BID) | ORAL | 0 refills | Status: AC

## 2023-10-09 MED ORDER — ACETAMINOPHEN 325 MG PO TABS
ORAL_TABLET | ORAL | Status: AC
Start: 2023-10-09 — End: ?
  Filled 2023-10-09: qty 2

## 2023-10-09 MED ORDER — BENZONATATE 100 MG PO CAPS: 100.0000 mg | ORAL_CAPSULE | Freq: Three times a day (TID) | ORAL | 0 refills | Status: AC | PRN

## 2023-10-09 MED ORDER — ACETAMINOPHEN 325 MG PO TABS
650.0000 mg | ORAL_TABLET | Freq: Once | ORAL | Status: AC
  Administered 2023-10-09: 650 mg via ORAL

## 2023-10-09 MED ORDER — IBUPROFEN 600 MG PO TABS
600.0000 mg | ORAL_TABLET | Freq: Three times a day (TID) | ORAL | 0 refills | Status: AC | PRN
Start: 2023-10-09 — End: ?

## 2023-10-09 NOTE — ED Provider Notes (Signed)
 [facility]    CSN: [id number] Arrival date & time: 10/09/23  7846      History   Chief Complaint Chief Complaint  Patient presents with   Cough    HPI [name] is a 53 y.o. female.    Cough Here for continued cough, fever, and chest burning. She feels shortness of breath. No n/v/d.  Symptoms began on 2/19. She was seen 2/20 and diagnosed with the flu, but no antiviral rx'd at the time.  No h/o DM or HTN.  NKDA  Past Medical History:  Diagnosis Date   Eczema    Neuromuscular disorder [facility])     Patient Active Problem List   Diagnosis Date Noted   Pelvic organ prolapse quantification stage 2 cystocele 06/27/2023   Constipation 06/27/2023   Pelvic pain 04/12/2023   DDD (degenerative disc disease), lumbosacral 10/03/2013   Arthropathy, multiple sites 04/23/2007    Past Surgical History:  Procedure Laterality Date   CESAREAN SECTION     one section     OB History     Gravida  2   Para  2   Term  2   Preterm      AB      Living  2      SAB      IAB      Ectopic      Multiple      Live Births  2            Home Medications    Prior to Admission medications   Medication Sig Start Date End Date Taking? Authorizing Provider  benzonatate (TESSALON) 100 MG capsule Take 1 capsule (100 mg total) by mouth 3 (three) times daily as needed for cough. 10/09/23  Yes Zenia Resides, MD  doxycycline (VIBRAMYCIN) 100 MG capsule Take 1 capsule (100 mg total) by mouth 2 (two) times daily for 7 days. 10/09/23 10/16/23 Yes Zenia Resides, MD  ibuprofen (ADVIL) 600 MG tablet Take 1 tablet (600 mg total) by mouth every 8 (eight) hours as needed (pain). 10/09/23  Yes Zenia Resides, MD    Family History History reviewed. No pertinent family history.  Social History Social History   Tobacco Use   Smoking status: Never   Smokeless tobacco: Never  Vaping Use   Vaping status: Never Used  Substance Use Topics   Alcohol use:  No   Drug use: No     Allergies   Patient has no known allergies.   Review of Systems Review of Systems  Respiratory:  Positive for cough.      Physical Exam Triage Vital Signs ED Triage Vitals  Encounter Vitals Group     BP 10/09/23 0910 95/61     Systolic BP Percentile --      Diastolic BP Percentile --      Pulse Rate 10/09/23 0910 92     Resp 10/09/23 0910 18     Temp 10/09/23 0910 (!) 101.3 F (38.5 C)     Temp Source 10/09/23 0910 Oral     SpO2 10/09/23 0910 97 %     Weight --      Height 10/09/23 0913 5\' 6"  (1.676 m)     Head Circumference --      Peak Flow --      Pain Score --      Pain Loc --      Pain Education --      Exclude from [employer]  Chart --    No data found.  Updated Vital Signs BP 95/61 (BP Location: Left Arm)   Pulse 92   Temp (!) 101.3 F (38.5 C) (Oral)   Resp 18   Ht 5\' 6"  (1.676 m)   LMP 05/02/2014 Comment: irregular cycle  SpO2 97%   BMI 21.40 kg/m   Visual Acuity Right Eye Distance:   Left Eye Distance:   Bilateral Distance:    Right Eye Near:   Left Eye Near:    Bilateral Near:     Physical Exam   UC Treatments / Results  Labs (all labs ordered are listed, but only abnormal results are displayed) Labs Reviewed - No data to display  EKG   Radiology No results found.  Procedures Procedures (including critical care time)  Medications Ordered in UC Medications  acetaminophen (TYLENOL) tablet 650 mg (650 mg Oral Given 10/09/23 0917)    Initial Impression / Assessment and Plan / UC Course  I have reviewed the triage vital signs and the nursing notes.  Pertinent labs & imaging results that were available during my care of the patient were reviewed by me and considered in my medical decision making (see chart for details).     By my review.  Chest x-ray shows some stranding with hilar and lower lobe areas. Doxycycline is sent in for probable pneumonia. They are advised of radiology over read.  Tessalon  Perles are sent in for cough Final Clinical Impressions(s) / UC Diagnoses   Final diagnoses:  Acute cough  Community acquired pneumonia, unspecified laterality     Discharge Instructions      On my review there is pneumonia in the lower lungs.  The radiologist will also read your x-ray, and if their interpretation differs significantly from mine, and it would change your management, we will call you.  Take doxycycline 100 mg --1 capsule 2 times daily for 7 days  Take ibuprofen 600 mg--1 tab every 8 hours as needed for pain.  Take benzonatate 100 mg, 1 tab every 8 hours as needed for cough.       ED Prescriptions     Medication Sig Dispense Auth. Provider   ibuprofen (ADVIL) 600 MG tablet Take 1 tablet (600 mg total) by mouth every 8 (eight) hours as needed (pain). 15 tablet Tricia Pledger, Janace Aris, MD   doxycycline (VIBRAMYCIN) 100 MG capsule Take 1 capsule (100 mg total) by mouth 2 (two) times daily for 7 days. 14 capsule Zebediah Beezley, Janace Aris, MD   benzonatate (TESSALON) 100 MG capsule Take 1 capsule (100 mg total) by mouth 3 (three) times daily as needed for cough. 21 capsule Zenia Resides, MD      PDMP not reviewed this encounter.   Zenia Resides, MD 10/09/23 [phone]

## 2023-10-09 NOTE — Discharge Instructions (Addendum)
 On my review there is pneumonia in the lower lungs.  The radiologist will also read your x-ray, and if their interpretation differs significantly from mine, and it would change your management, we will call you.  Take doxycycline 100 mg --1 capsule 2 times daily for 7 days  Take ibuprofen 600 mg--1 tab every 8 hours as needed for pain.  Take benzonatate 100 mg, 1 tab every 8 hours as needed for cough.

## 2023-10-09 NOTE — ED Triage Notes (Signed)
 Patient here today with c/o cough, fever, headache, and body aches since Wednesday. Went to urgent care last Thursday and was told that she has the flu. Patient has been taking Tylenol and IBU with no relief. Last dose of Tylenol with yesterday.

## 2023-10-10 ENCOUNTER — Ambulatory Visit: Payer: BC Managed Care – PPO | Admitting: Family Medicine

## 2023-10-12 ENCOUNTER — Ambulatory Visit: Payer: Self-pay | Admitting: Emergency Medicine

## 2023-10-12 NOTE — Telephone Encounter (Signed)
  Chief Complaint: Near non stop cough, pt's chest hurts from coughing Symptoms: above Frequency: 10/04/2023 Pertinent Negatives: Patient denies SOB Disposition: [] ED /[x] Urgent Care (no appt availability in office) / [] Appointment(In office/virtual)/ []  [place] [facility]/ [] Home Care/ [] Refused Recommended Disposition /[] [facility]/ []  Follow-up with PCP Additional Notes: Call dropped prior to transfer. Returned call, s/w husband. Husband states that pt is coughing nearly constantly. She is coughing so much her chest hurts. Pt has been seen 2 times at [facility] and diagnosed with FLU and pneumonia. No appts in office today. Husband does not want to go to another office. He will take pt back to UC for care.    Copied from CRM [id number]. Topic: Clinical - Red Word Triage >> Oct 12, 2023  8:56 AM [place] A wrote: [facility] that prompted transfer to Nurse Triage: Patient's husband called said patient has gone to Urgent Care twice and they said she had Pneumonia then the Flu. Patient is having severe coughing and pain in chest area Reason for Disposition  [1] Continuous (nonstop) coughing interferes with work or school AND [2] no improvement using cough treatment per Care Advice  Answer Assessment - Initial Assessment Questions 1. ONSET: "When did the cough begin?"      2/192025 2. SEVERITY: "How bad is the cough today?"      Near constant 5. DIFFICULTY BREATHING: "Are you having difficulty breathing?" If Yes, ask: "How bad is it?" (e.g., mild, moderate, severe)    - MILD: No SOB at rest, mild SOB with walking, speaks normally in sentences, can lie down, no retractions, pulse < 100.    - MODERATE: SOB at rest, SOB with minimal exertion and prefers to sit, cannot lie down flat, speaks in phrases, mild retractions, audible wheezing, pulse 100-120.    - SEVERE: Very SOB at rest, speaks in single words, struggling to breathe, sitting hunched forward, retractions, pulse > 120      Does not  report difficulty breathing 10. OTHER SYMPTOMS: "Do you have any other symptoms?" (e.g., runny nose, wheezing, chest pain)       Chest hurts from coughing  Protocols used: Cough - Acute Non-Productive-A-AH

## 2024-05-31 ENCOUNTER — Telehealth: Payer: Self-pay | Admitting: Emergency Medicine

## 2024-05-31 NOTE — Telephone Encounter (Signed)
 Negative.  I will not be taking her as a new patient.  Thanks.

## 2024-05-31 NOTE — Telephone Encounter (Signed)
 Pt came in explaining that they're daughter needs an PCP due to her just turning 34. I stated to the pt that Dr. Sagardia is not taking new pts at the moment but I will try to see if there is any was you can be added to his schedule by sending a message. Please advise,  Thanks
